# Patient Record
Sex: Female | Born: 1950 | Race: White | Hispanic: No | Marital: Married | Smoking: Never smoker
Health system: Southern US, Community
[De-identification: ages and names within clinical notes are randomized; demographics above are authoritative.]

## PROBLEM LIST (undated history)

## (undated) DIAGNOSIS — E039 Hypothyroidism, unspecified: Secondary | ICD-10-CM

## (undated) DIAGNOSIS — M1990 Unspecified osteoarthritis, unspecified site: Secondary | ICD-10-CM

## (undated) DIAGNOSIS — K219 Gastro-esophageal reflux disease without esophagitis: Secondary | ICD-10-CM

## (undated) DIAGNOSIS — M8580 Other specified disorders of bone density and structure, unspecified site: Secondary | ICD-10-CM

## (undated) DIAGNOSIS — C189 Malignant neoplasm of colon, unspecified: Secondary | ICD-10-CM

---

## 1988-12-29 HISTORY — PX: BREAST CYST ASPIRATION: SHX578

## 2004-11-22 ENCOUNTER — Ambulatory Visit: Payer: Self-pay

## 2005-12-15 ENCOUNTER — Ambulatory Visit: Payer: Self-pay

## 2006-02-03 ENCOUNTER — Ambulatory Visit: Payer: Self-pay | Admitting: General Practice

## 2006-03-12 ENCOUNTER — Ambulatory Visit: Payer: Self-pay | Admitting: Unknown Physician Specialty

## 2006-03-18 ENCOUNTER — Ambulatory Visit: Payer: Self-pay | Admitting: Unknown Physician Specialty

## 2006-03-20 ENCOUNTER — Other Ambulatory Visit: Payer: Self-pay

## 2006-03-27 ENCOUNTER — Inpatient Hospital Stay: Payer: Self-pay | Admitting: Surgery

## 2006-12-16 ENCOUNTER — Ambulatory Visit: Payer: Self-pay | Admitting: Internal Medicine

## 2007-03-10 ENCOUNTER — Ambulatory Visit: Payer: Self-pay | Admitting: Unknown Physician Specialty

## 2007-11-15 ENCOUNTER — Ambulatory Visit: Payer: Self-pay | Admitting: Internal Medicine

## 2007-12-20 ENCOUNTER — Ambulatory Visit: Payer: Self-pay | Admitting: Internal Medicine

## 2008-09-29 ENCOUNTER — Ambulatory Visit: Payer: Self-pay | Admitting: Physician Assistant

## 2008-11-17 ENCOUNTER — Ambulatory Visit: Payer: Self-pay | Admitting: Internal Medicine

## 2008-12-29 DIAGNOSIS — C189 Malignant neoplasm of colon, unspecified: Secondary | ICD-10-CM

## 2008-12-29 HISTORY — DX: Malignant neoplasm of colon, unspecified: C18.9

## 2009-04-23 ENCOUNTER — Ambulatory Visit: Payer: Self-pay | Admitting: Unknown Physician Specialty

## 2009-11-21 ENCOUNTER — Ambulatory Visit: Payer: Self-pay | Admitting: Internal Medicine

## 2010-12-19 ENCOUNTER — Ambulatory Visit: Payer: Self-pay | Admitting: Internal Medicine

## 2012-01-13 ENCOUNTER — Ambulatory Visit: Payer: Self-pay | Admitting: Internal Medicine

## 2012-06-25 ENCOUNTER — Ambulatory Visit: Payer: Self-pay | Admitting: Otolaryngology

## 2012-07-21 ENCOUNTER — Ambulatory Visit: Payer: Self-pay | Admitting: Unknown Physician Specialty

## 2012-07-23 LAB — PATHOLOGY REPORT

## 2012-11-01 ENCOUNTER — Ambulatory Visit: Payer: Self-pay | Admitting: Otolaryngology

## 2012-11-03 LAB — PATHOLOGY REPORT

## 2013-01-11 ENCOUNTER — Ambulatory Visit: Payer: Self-pay | Admitting: Internal Medicine

## 2014-01-12 ENCOUNTER — Ambulatory Visit: Payer: Self-pay | Admitting: Internal Medicine

## 2014-04-17 ENCOUNTER — Ambulatory Visit: Payer: Self-pay | Admitting: Internal Medicine

## 2014-06-08 DIAGNOSIS — E785 Hyperlipidemia, unspecified: Secondary | ICD-10-CM | POA: Insufficient documentation

## 2014-06-08 DIAGNOSIS — I10 Essential (primary) hypertension: Secondary | ICD-10-CM | POA: Insufficient documentation

## 2014-06-08 DIAGNOSIS — M810 Age-related osteoporosis without current pathological fracture: Secondary | ICD-10-CM | POA: Insufficient documentation

## 2014-06-08 DIAGNOSIS — J45909 Unspecified asthma, uncomplicated: Secondary | ICD-10-CM | POA: Insufficient documentation

## 2014-06-08 DIAGNOSIS — C189 Malignant neoplasm of colon, unspecified: Secondary | ICD-10-CM | POA: Insufficient documentation

## 2014-06-08 DIAGNOSIS — E039 Hypothyroidism, unspecified: Secondary | ICD-10-CM | POA: Insufficient documentation

## 2014-06-20 DIAGNOSIS — M5137 Other intervertebral disc degeneration, lumbosacral region: Secondary | ICD-10-CM | POA: Insufficient documentation

## 2014-08-28 ENCOUNTER — Ambulatory Visit: Payer: Self-pay | Admitting: Internal Medicine

## 2014-09-29 DIAGNOSIS — M5416 Radiculopathy, lumbar region: Secondary | ICD-10-CM | POA: Insufficient documentation

## 2014-11-10 DIAGNOSIS — Z85828 Personal history of other malignant neoplasm of skin: Secondary | ICD-10-CM | POA: Insufficient documentation

## 2015-01-15 ENCOUNTER — Ambulatory Visit: Payer: Self-pay | Admitting: Internal Medicine

## 2015-04-17 NOTE — Op Note (Signed)
PATIENT NAME:  Alexa Perry, Alexa Perry MR#:  [id number] DATE OF BIRTH:  July 30, 1951  DATE OF PROCEDURE:  11/01/2012  PREOPERATIVE DIAGNOSES:  1. Right thyroid nodule.  2. Dysphagia.   POSTOPERATIVE DIAGNOSES:  1. Right thyroid nodule.  2. Dysphagia.  PROCEDURE PERFORMED: Minimally invasive right hemithyroidectomy with laryngeal nerve monitoring.   SURGEON: Jerene Bears, MD   ASSISTANT:  Genene Churn, MD  ANESTHESIA: General endotracheal anesthesia.   ESTIMATED BLOOD LOSS: Less than 10 mL.   IV FLUIDS: Please see anesthesia record.   COMPLICATIONS: None.   DRAINS/STENT PLACEMENTS: Surgicel.   SPECIMENS: Right hemithyroid marked with a stitch in the superior aspect.   INDICATIONS FOR PROCEDURE: The patient is a 64 year old female with a history of right-sided thyroid nodule with persistent globus and dysphagia.   OPERATIVE FINDINGS: Right-sided recurrent laryngeal nerve was identified and preserved. A 1.5 cm nodule was adjacent to the insertion of the recurrent laryngeal nerve. The nodule was soft, and the nerve was protected. The right inferior and superior parathyroid glands were identified and preserved.   DESCRIPTION OF PROCEDURE: After the patient was identified in holding, the benefits and risks of the procedure were discussed and consent was reviewed. The patient was taken to the Operating Room and placed in the supine position. General endotracheal anesthesia was induced with laryngeal nerve monitoring. Care was taken to ensure proper placement of the electrodes on the patient's vocal fold. At this time the patient was prepped and draped in a sterile fashion, and injection of 5 mL of 0.25% Sensorcaine with 1:200,000 epinephrine was made. The previously marked skin crease was used for incision with a 15 blade scalpel, and dissection was carried through subcutaneous tissues with Bovie electrocautery. The platysma was divided. The median raphe of the strap muscles was  encountered. This was divided vertically from the sternal notch to the thyroid notch. The sternothyroid and sternohyoid muscles were separated from the anterior border of the thyroid gland on the patient's right side. This demonstrated some mild thickening of the thyroid capsule. There was a median thyroid vein which was ligated with the Harmonic scalpel. A cleft between the trachea and the right superior thyroid lobe was entered with blunt techniques, and the right superior thyroid vessels were isolated and pedicled; and a curved hemostat was attached to the superior pole, and the superior pole was ligated using a Harmonic scalpel. At this time after the superior vessels were ligated, more blunt dissection was made inferiorly. This demonstrated a large nodule  just overlying the recurrent laryngeal nerve. The nerve was identified in the tracheoesophageal groove. The  inferior and superior parathyroid glands were identified and protected. The nerve was traced inferiorly and superiorly until its insertion into the patient's larynx, and carefully the right  hemithyroid was dissected away from the trachea and the larynx. Care was taken to avoid injury to nerve. The nerve had robust stimulation throughout the duration of the case as well as at the conclusion of the case. At this time, the remaining attachments of the right hemithyroid were separated from the trachea. The isthmus was divided slightly left to midline with the Harmonic scalpel, and the specimen was marked with a stitch in the superior pole on the right side and was passed off the table for permanent pathological evaluation. At this time, the patient's wound was copiously irrigated with sterile saline. Meticulous hemostasis was achieved. Surgicel was placed along the thyroid bed. Strap muscles were reapproximated with figure-of-eight Vicryl stitch of 4-0, and then the  subcutaneous tissues were reapproximated with three interrupted 4-0 Vicryls. The skin was  closed with Dermabond skin adhesive and topped with a Steri-Strip. Care of the patient at this time was transferred to anesthesia.  ____________________________ Jerene Bears, MD ccv:cbb D: 11/01/2012 09:13:04 ET T: 11/01/2012 10:28:47 ET JOB#: [id number]  cc: Jerene Bears, MD, <Dictator> Jerene Bears MD ELECTRONICALLY SIGNED 11/01/2012 12:02

## 2015-12-26 ENCOUNTER — Other Ambulatory Visit: Payer: Self-pay | Admitting: Internal Medicine

## 2015-12-26 DIAGNOSIS — Z1231 Encounter for screening mammogram for malignant neoplasm of breast: Secondary | ICD-10-CM

## 2016-01-17 ENCOUNTER — Ambulatory Visit
Admission: RE | Admit: 2016-01-17 | Discharge: 2016-01-17 | Disposition: A | Discharge status: Home / Self Care | Payer: BC Managed Care – PPO | Source: Ambulatory Visit | Attending: Internal Medicine | Admitting: Internal Medicine

## 2016-01-17 DIAGNOSIS — Z1231 Encounter for screening mammogram for malignant neoplasm of breast: Secondary | ICD-10-CM

## 2016-01-17 HISTORY — DX: Malignant neoplasm of colon, unspecified: C18.9

## 2016-12-08 ENCOUNTER — Other Ambulatory Visit: Payer: Self-pay | Admitting: Internal Medicine

## 2016-12-08 DIAGNOSIS — Z1231 Encounter for screening mammogram for malignant neoplasm of breast: Secondary | ICD-10-CM

## 2017-01-28 ENCOUNTER — Ambulatory Visit
Admission: RE | Admit: 2017-01-28 | Discharge: 2017-01-28 | Disposition: A | Discharge status: Home / Self Care | Payer: BC Managed Care – PPO | Source: Ambulatory Visit | Attending: Internal Medicine | Admitting: Internal Medicine

## 2017-01-28 DIAGNOSIS — Z1231 Encounter for screening mammogram for malignant neoplasm of breast: Secondary | ICD-10-CM

## 2017-10-22 ENCOUNTER — Other Ambulatory Visit: Payer: Self-pay | Admitting: Internal Medicine

## 2017-10-22 DIAGNOSIS — Z1231 Encounter for screening mammogram for malignant neoplasm of breast: Secondary | ICD-10-CM

## 2018-02-01 ENCOUNTER — Ambulatory Visit
Admission: RE | Admit: 2018-02-01 | Discharge: 2018-02-01 | Disposition: A | Discharge status: Home / Self Care | Payer: BC Managed Care – PPO | Source: Ambulatory Visit | Attending: Internal Medicine | Admitting: Internal Medicine

## 2018-02-01 DIAGNOSIS — Z1231 Encounter for screening mammogram for malignant neoplasm of breast: Secondary | ICD-10-CM

## 2018-06-01 ENCOUNTER — Ambulatory Visit
Admission: RE | Admit: 2018-06-01 | Discharge: 2018-06-01 | Disposition: A | Discharge status: Home / Self Care | Payer: Medicare Other | Source: Ambulatory Visit | Attending: Otolaryngology | Admitting: Otolaryngology

## 2018-06-01 ENCOUNTER — Other Ambulatory Visit: Payer: Self-pay | Admitting: Otolaryngology

## 2018-06-01 DIAGNOSIS — R05 Cough: Secondary | ICD-10-CM | POA: Insufficient documentation

## 2018-06-01 DIAGNOSIS — R059 Cough, unspecified: Secondary | ICD-10-CM

## 2018-06-01 DIAGNOSIS — R918 Other nonspecific abnormal finding of lung field: Secondary | ICD-10-CM | POA: Diagnosis not present

## 2018-11-24 ENCOUNTER — Other Ambulatory Visit: Payer: Self-pay | Admitting: Internal Medicine

## 2018-11-24 DIAGNOSIS — Z1231 Encounter for screening mammogram for malignant neoplasm of breast: Secondary | ICD-10-CM

## 2019-02-02 ENCOUNTER — Ambulatory Visit
Admission: RE | Admit: 2019-02-02 | Discharge: 2019-02-02 | Disposition: A | Discharge status: Home / Self Care | Payer: Medicare Other | Source: Ambulatory Visit | Attending: Internal Medicine | Admitting: Internal Medicine

## 2019-02-02 DIAGNOSIS — Z1231 Encounter for screening mammogram for malignant neoplasm of breast: Secondary | ICD-10-CM | POA: Diagnosis present

## 2019-12-26 ENCOUNTER — Other Ambulatory Visit: Payer: Self-pay | Admitting: Internal Medicine

## 2019-12-26 DIAGNOSIS — Z1231 Encounter for screening mammogram for malignant neoplasm of breast: Secondary | ICD-10-CM

## 2020-02-06 ENCOUNTER — Ambulatory Visit
Admission: RE | Admit: 2020-02-06 | Discharge: 2020-02-06 | Disposition: A | Discharge status: Home / Self Care | Payer: Medicare Other | Source: Ambulatory Visit | Attending: Internal Medicine | Admitting: Internal Medicine

## 2020-02-06 DIAGNOSIS — Z1231 Encounter for screening mammogram for malignant neoplasm of breast: Secondary | ICD-10-CM | POA: Insufficient documentation

## 2020-05-07 ENCOUNTER — Encounter: Payer: Self-pay | Admitting: Gastroenterology

## 2020-05-07 ENCOUNTER — Other Ambulatory Visit: Payer: Self-pay

## 2020-05-07 ENCOUNTER — Ambulatory Visit (INDEPENDENT_AMBULATORY_CARE_PROVIDER_SITE_OTHER): Payer: Medicare Other | Admitting: Gastroenterology

## 2020-05-07 VITALS — BP 101/71 | HR 85 | Temp 98.3°F | Ht 64.0 in | Wt 168.0 lb

## 2020-05-07 DIAGNOSIS — G8929 Other chronic pain: Secondary | ICD-10-CM

## 2020-05-07 DIAGNOSIS — R140 Abdominal distension (gaseous): Secondary | ICD-10-CM

## 2020-05-07 DIAGNOSIS — K219 Gastro-esophageal reflux disease without esophagitis: Secondary | ICD-10-CM | POA: Diagnosis not present

## 2020-05-07 DIAGNOSIS — R1013 Epigastric pain: Secondary | ICD-10-CM | POA: Diagnosis not present

## 2020-05-07 NOTE — Progress Notes (Signed)
Gastroenterology Consultation  Referring Provider:     Idelle Perry, Alexa Perry:  Alexa Perry, Alexa Perry:  Alexa Perry     Reason for Consultation:     GERD and abdominal pain        HPI:   Alexa Perry is a 68 y.o. y/o female referred for consultation & management of GERD and abdominal pain by Alexa Perry, Alexa Perry, Alexa Perry.  This patient comes in today with a history of having a colon resection by Alexa Perry for colon cancer.  The patient has been getting follow-up colonoscopies in the past by Alexa Perry but has not had a repeat colonoscopy in a number of years.  The patient states that she is concerned about that.  She is on omeprazole once a day and she takes it in the morning and was given a H2 blocker at night but states that she continues to have nocturnal acid breakthrough on a regular basis.  She stopped taking the H2 blocker at night and has not noticed much difference.  The patient denies any unexplained weight loss.  She also denies any black stools or bloody stools. Patient also reports that approximately 3 times a year she gets severe abdominal pain that she states makes her abdomen feel hard and bloated with distention and it last for up to 48 hours.  There is no report of any worsening of her symptoms or any foods that cause her to have this.  Past Medical History:  Diagnosis Date  . Colon cancer ([place]) 2010    Past Surgical History:  Procedure Laterality Date  . BREAST CYST ASPIRATION Left 1990    Prior to Admission medications   Medication Sig Start Date End Date Taking? Authorizing Provider  Adapalene-Benzoyl Peroxide 0.1-2.5 % gel as needed Apply thinly to face at night 03/30/15  Yes Provider, Historical, Alexa Perry  cetirizine (ZYRTEC) 10 MG tablet Take by mouth.   Yes Provider, Historical, Alexa Perry  Cholecalciferol 50 MCG (2000 UT) CAPS Take by mouth.   Yes Provider, Historical, Alexa Perry  Docusate Sodium (DSS) 100 MG CAPS Take by mouth.   Yes  Provider, Historical, Alexa Perry  fluticasone (FLONASE) 50 MCG/ACT nasal spray SMARTSIG:2 Spray(s) Both Nares Every Night 03/28/20  Yes Provider, Historical, Alexa Perry  hydrochlorothiazide (HYDRODIURIL) 25 MG tablet Take 25 mg by mouth daily. 01/09/20  Yes Provider, Historical, Alexa Perry  levothyroxine (SYNTHROID) 125 MCG tablet PLEASE SEE ATTACHED FOR DETAILED DIRECTIONS 04/13/20  Yes Provider, Historical, Alexa Perry  naproxen sodium (ALEVE) 220 MG tablet Take by mouth.   Yes Provider, Historical, Alexa Perry  omeprazole (PRILOSEC) 40 MG capsule Take 40 mg by mouth daily. 04/15/20  Yes Provider, Historical, Alexa Perry    Family History  Problem Relation Age of Onset  . Breast cancer Mother 38     Social History   Tobacco Use  . Smoking status: Never Smoker  . Smokeless tobacco: Never Used  Substance Use Topics  . Alcohol use: Yes  . Drug use: Never    Allergies as of 05/07/2020  . (No Known Allergies)    Review of Systems:    All systems reviewed and negative except where noted in HPI.   Physical Exam:  BP 101/71   Pulse 85   Temp 98.3 F (36.8 C) (Temporal)   Ht 5\' 4"  (1.626 m)   Wt 168 lb (76.2 kg)   BMI 28.84 kg/m  No LMP recorded. Patient is postmenopausal. General:   Alert,  Well-developed, well-nourished, pleasant  and cooperative in NAD Head:  Normocephalic and atraumatic. Eyes:  Sclera clear, no icterus.   Conjunctiva pink. Ears:  Normal auditory acuity. Neck:  Supple; no masses or thyromegaly. Lungs:  Respirations even and unlabored.  Clear throughout to auscultation.   No wheezes, crackles, or rhonchi. No acute distress. Heart:  Regular rate and rhythm; no murmurs, clicks, rubs, or gallops. Abdomen:  Normal bowel sounds.  No bruits.  Soft, non-tender and non-distended without masses, hepatosplenomegaly or hernias noted.  No guarding or rebound tenderness.  Negative Carnett sign.   Rectal:  Deferred.  Pulses:  Normal pulses noted. Extremities:  No clubbing or edema.  No cyanosis. Neurologic:  Alert and  oriented x3;  grossly normal neurologically. Skin:  Intact without significant lesions or rashes.  No jaundice. Lymph Nodes:  No significant cervical adenopathy. Psych:  Alert and cooperative. Normal mood and affect.  Imaging Studies: No results found.  Assessment and Plan:   Alexa Perry is a 69 y.o. y/o female who comes in today with a history of colon cancer with a resection.  The patient now reports that she is due for a colonoscopy and she will also have an upper endoscopy at that time due to her concerns because of chronic reflux symptoms.  The patient has been given samples of Dexilant which she will take in the evening since most of her symptoms occur at 2:00 am- 3:00am.  She has also been told that the [place] does not need to be adjusted with food like omeprazole.  The patient has also been told to avoid chewing gum, drinking carbonated drinks, eating fast or using straws to decrease the amount of air she is swallowing to try and alleviate some of the bloating she is having.  The patient has been explained the plan agrees with it.  She will follow up at the time of the procedures.    Alexa Perry, Alexa Perry. Marval Regal    Note: This dictation was prepared with Dragon dictation along with smaller phrase technology. Any transcriptional errors that result from this process are unintentional.

## 2020-05-07 NOTE — H&P (View-Only) (Signed)
Gastroenterology Consultation  Referring Provider:     Idelle Crouch, MD Primary Care Physician:  Idelle Crouch, MD Primary Gastroenterologist:  Dr. Allen Norris     Reason for Consultation:     GERD and abdominal pain        HPI:   Alexa Perry is a 69 y.o. y/o female referred for consultation & management of GERD and abdominal pain by Dr. Doy Hutching, Leonie Douglas, MD.  This patient comes in today with a history of having a colon resection by Dr. Pat Patrick for colon cancer.  The patient has been getting follow-up colonoscopies in the past by Dr. Vira Agar but has not had a repeat colonoscopy in a number of years.  The patient states that she is concerned about that.  She is on omeprazole once a day and she takes it in the morning and was given a H2 blocker at night but states that she continues to have nocturnal acid breakthrough on a regular basis.  She stopped taking the H2 blocker at night and has not noticed much difference.  The patient denies any unexplained weight loss.  She also denies any black stools or bloody stools. Patient also reports that approximately 3 times a year she gets severe abdominal pain that she states makes her abdomen feel hard and bloated with distention and it last for up to 48 hours.  There is no report of any worsening of her symptoms or any foods that cause her to have this.  Past Medical History:  Diagnosis Date  . Colon cancer ([place]) 2010    Past Surgical History:  Procedure Laterality Date  . BREAST CYST ASPIRATION Left 1990    Prior to Admission medications   Medication Sig Start Date End Date Taking? Authorizing Provider  Adapalene-Benzoyl Peroxide 0.1-2.5 % gel as needed Apply thinly to face at night 03/30/15  Yes [provider]  cetirizine (ZYRTEC) 10 MG tablet Take by mouth.   Yes [provider]  Cholecalciferol 50 MCG (2000 UT) CAPS Take by mouth.   Yes [provider]  Docusate Sodium (DSS) 100 MG CAPS Take by mouth.   Yes  [provider]  fluticasone (FLONASE) 50 MCG/ACT nasal spray SMARTSIG:2 Spray(s) Both Nares Every Night 03/28/20  Yes [provider]  hydrochlorothiazide (HYDRODIURIL) 25 MG tablet Take 25 mg by mouth daily. 01/09/20  Yes [provider]  levothyroxine (SYNTHROID) 125 MCG tablet PLEASE SEE ATTACHED FOR DETAILED DIRECTIONS 04/13/20  Yes [provider]  naproxen sodium (ALEVE) 220 MG tablet Take by mouth.   Yes [provider]  omeprazole (PRILOSEC) 40 MG capsule Take 40 mg by mouth daily. 04/15/20  Yes [provider]    Family History  Problem Relation Age of Onset  . Breast cancer Mother 8     Social History   Tobacco Use  . Smoking status: Never Smoker  . Smokeless tobacco: Never Used  Substance Use Topics  . Alcohol use: Yes  . Drug use: Never    Allergies as of 05/07/2020  . (No Known Allergies)    Review of Systems:    All systems reviewed and negative except where noted in HPI.   Physical Exam:  BP 101/71   Pulse 85   Temp 98.3 F (36.8 C) (Temporal)   Ht 5\' 4"  (1.626 m)   Wt 168 lb (76.2 kg)   BMI 28.84 kg/m  No LMP recorded. Patient is postmenopausal. General:   Alert,  Well-developed, well-nourished, pleasant  and cooperative in NAD Head:  Normocephalic and atraumatic. Eyes:  Sclera clear, no icterus.   Conjunctiva pink. Ears:  Normal auditory acuity. Neck:  Supple; no masses or thyromegaly. Lungs:  Respirations even and unlabored.  Clear throughout to auscultation.   No wheezes, crackles, or rhonchi. No acute distress. Heart:  Regular rate and rhythm; no murmurs, clicks, rubs, or gallops. Abdomen:  Normal bowel sounds.  No bruits.  Soft, non-tender and non-distended without masses, hepatosplenomegaly or hernias noted.  No guarding or rebound tenderness.  Negative Carnett sign.   Rectal:  Deferred.  Pulses:  Normal pulses noted. Extremities:  No clubbing or edema.  No cyanosis. Neurologic:  Alert and  oriented x3;  grossly normal neurologically. Skin:  Intact without significant lesions or rashes.  No jaundice. Lymph Nodes:  No significant cervical adenopathy. Psych:  Alert and cooperative. Normal mood and affect.  Imaging Studies: No results found.  Assessment and Plan:   Alexa Perry is a 69 y.o. y/o female who comes in today with a history of colon cancer with a resection.  The patient now reports that she is due for a colonoscopy and she will also have an upper endoscopy at that time due to her concerns because of chronic reflux symptoms.  The patient has been given samples of Dexilant which she will take in the evening since most of her symptoms occur at 2:00 am- 3:00am.  She has also been told that the [place] does not need to be adjusted with food like omeprazole.  The patient has also been told to avoid chewing gum, drinking carbonated drinks, eating fast or using straws to decrease the amount of air she is swallowing to try and alleviate some of the bloating she is having.  The patient has been explained the plan agrees with it.  She will follow up at the time of the procedures.    Lucilla Lame, MD. Marval Regal    Note: This dictation was prepared with Dragon dictation along with smaller phrase technology. Any transcriptional errors that result from this process are unintentional.

## 2020-05-08 ENCOUNTER — Encounter: Payer: Self-pay | Admitting: Gastroenterology

## 2020-05-08 ENCOUNTER — Other Ambulatory Visit: Payer: Self-pay

## 2020-05-08 ENCOUNTER — Other Ambulatory Visit
Admission: RE | Admit: 2020-05-08 | Discharge: 2020-05-08 | Disposition: A | Discharge status: Home / Self Care | Payer: Medicare Other | Source: Ambulatory Visit | Attending: Gastroenterology | Admitting: Gastroenterology

## 2020-05-08 DIAGNOSIS — Z20822 Contact with and (suspected) exposure to covid-19: Secondary | ICD-10-CM | POA: Insufficient documentation

## 2020-05-08 DIAGNOSIS — Z01812 Encounter for preprocedural laboratory examination: Secondary | ICD-10-CM | POA: Insufficient documentation

## 2020-05-08 LAB — SARS CORONAVIRUS 2 (TAT 6-24 HRS): SARS Coronavirus 2: NEGATIVE

## 2020-05-09 NOTE — Discharge Instructions (Signed)
General Anesthesia, Adult, Care After This sheet gives you information about how to care for yourself after your procedure. Your health care provider may also give you more specific instructions. If you have problems or questions, contact your health care provider. What can I expect after the procedure? After the procedure, the following side effects are common:  Pain or discomfort at the IV site.  Nausea.  Vomiting.  Sore throat.  Trouble concentrating.  Feeling cold or chills.  Weak or tired.  Sleepiness and fatigue.  Soreness and body aches. These side effects can affect parts of the body that were not involved in surgery. Follow these instructions at home:  For at least 24 hours after the procedure:  Have a responsible adult stay with you. It is important to have someone help care for you until you are awake and alert.  Rest as needed.  Do not: ? Participate in activities in which you could fall or become injured. ? Drive. ? Use heavy machinery. ? Drink alcohol. ? Take sleeping pills or medicines that cause drowsiness. ? Make important decisions or sign legal documents. ? Take care of children on your own. Eating and drinking  Follow any instructions from your health care provider about eating or drinking restrictions.  When you feel hungry, start by eating small amounts of foods that are soft and easy to digest (bland), such as toast. Gradually return to your regular diet.  Drink enough fluid to keep your urine pale yellow.  If you vomit, rehydrate by drinking water, juice, or clear broth. General instructions  If you have sleep apnea, surgery and certain medicines can increase your risk for breathing problems. Follow instructions from your health care provider about wearing your sleep device: ? Anytime you are sleeping, including during daytime naps. ? While taking prescription pain medicines, sleeping medicines, or medicines that make you drowsy.  Return to  your normal activities as told by your health care provider. Ask your health care provider what activities are safe for you.  Take over-the-counter and prescription medicines only as told by your health care provider.  If you smoke, do not smoke without supervision.  Keep all follow-up visits as told by your health care provider. This is important. Contact a health care provider if:  You have nausea or vomiting that does not get better with medicine.  You cannot eat or drink without vomiting.  You have pain that does not get better with medicine.  You are unable to pass urine.  You develop a skin rash.  You have a fever.  You have redness around your IV site that gets worse. Get help right away if:  You have difficulty breathing.  You have chest pain.  You have blood in your urine or stool, or you vomit blood. Summary  After the procedure, it is common to have a sore throat or nausea. It is also common to feel tired.  Have a responsible adult stay with you for the first 24 hours after general anesthesia. It is important to have someone help care for you until you are awake and alert.  When you feel hungry, start by eating small amounts of foods that are soft and easy to digest (bland), such as toast. Gradually return to your regular diet.  Drink enough fluid to keep your urine pale yellow.  Return to your normal activities as told by your health care provider. Ask your health care provider what activities are safe for you. This information is not   intended to replace advice given to you by your health care provider. Make sure you discuss any questions you have with your health care provider. Document Revised: 12/18/2017 Document Reviewed: 07/31/2017 Elsevier Patient Education  2020 [employer]vier Inc.  

## 2020-05-10 ENCOUNTER — Encounter
Admission: RE | Disposition: A | Discharge status: Home / Self Care | Payer: Self-pay | Source: Home / Self Care | Attending: Gastroenterology

## 2020-05-10 ENCOUNTER — Other Ambulatory Visit: Payer: Self-pay

## 2020-05-10 ENCOUNTER — Ambulatory Visit: Payer: Medicare Other | Admitting: Anesthesiology

## 2020-05-10 ENCOUNTER — Ambulatory Visit
Admission: RE | Admit: 2020-05-10 | Discharge: 2020-05-10 | Disposition: A | Discharge status: Home / Self Care | Payer: Medicare Other | Attending: Gastroenterology | Admitting: Gastroenterology

## 2020-05-10 ENCOUNTER — Encounter: Payer: Self-pay | Admitting: Gastroenterology

## 2020-05-10 DIAGNOSIS — K219 Gastro-esophageal reflux disease without esophagitis: Secondary | ICD-10-CM | POA: Diagnosis not present

## 2020-05-10 DIAGNOSIS — K2970 Gastritis, unspecified, without bleeding: Secondary | ICD-10-CM | POA: Diagnosis not present

## 2020-05-10 DIAGNOSIS — I10 Essential (primary) hypertension: Secondary | ICD-10-CM | POA: Diagnosis not present

## 2020-05-10 DIAGNOSIS — R12 Heartburn: Secondary | ICD-10-CM | POA: Insufficient documentation

## 2020-05-10 DIAGNOSIS — J45909 Unspecified asthma, uncomplicated: Secondary | ICD-10-CM | POA: Diagnosis not present

## 2020-05-10 DIAGNOSIS — Z1211 Encounter for screening for malignant neoplasm of colon: Secondary | ICD-10-CM | POA: Insufficient documentation

## 2020-05-10 DIAGNOSIS — Z79899 Other long term (current) drug therapy: Secondary | ICD-10-CM | POA: Diagnosis not present

## 2020-05-10 DIAGNOSIS — K641 Second degree hemorrhoids: Secondary | ICD-10-CM | POA: Diagnosis not present

## 2020-05-10 DIAGNOSIS — Z803 Family history of malignant neoplasm of breast: Secondary | ICD-10-CM | POA: Insufficient documentation

## 2020-05-10 DIAGNOSIS — Z85038 Personal history of other malignant neoplasm of large intestine: Secondary | ICD-10-CM | POA: Diagnosis not present

## 2020-05-10 DIAGNOSIS — K635 Polyp of colon: Secondary | ICD-10-CM

## 2020-05-10 DIAGNOSIS — D124 Benign neoplasm of descending colon: Secondary | ICD-10-CM | POA: Insufficient documentation

## 2020-05-10 DIAGNOSIS — K2900 Acute gastritis without bleeding: Secondary | ICD-10-CM

## 2020-05-10 DIAGNOSIS — D125 Benign neoplasm of sigmoid colon: Secondary | ICD-10-CM | POA: Diagnosis not present

## 2020-05-10 DIAGNOSIS — E039 Hypothyroidism, unspecified: Secondary | ICD-10-CM | POA: Insufficient documentation

## 2020-05-10 DIAGNOSIS — K319 Disease of stomach and duodenum, unspecified: Secondary | ICD-10-CM | POA: Insufficient documentation

## 2020-05-10 DIAGNOSIS — Z980 Intestinal bypass and anastomosis status: Secondary | ICD-10-CM | POA: Diagnosis not present

## 2020-05-10 HISTORY — DX: Unspecified osteoarthritis, unspecified site: M19.90

## 2020-05-10 HISTORY — DX: Hypothyroidism, unspecified: E03.9

## 2020-05-10 HISTORY — PX: POLYPECTOMY: SHX5525

## 2020-05-10 HISTORY — DX: Gastro-esophageal reflux disease without esophagitis: K21.9

## 2020-05-10 HISTORY — PX: ESOPHAGOGASTRODUODENOSCOPY (EGD) WITH PROPOFOL: SHX5813

## 2020-05-10 HISTORY — DX: Other specified disorders of bone density and structure, unspecified site: M85.80

## 2020-05-10 HISTORY — PX: COLONOSCOPY WITH PROPOFOL: SHX5780

## 2020-05-10 SURGERY — COLONOSCOPY WITH PROPOFOL
Anesthesia: General | Site: Rectum

## 2020-05-10 MED ORDER — PROPOFOL 10 MG/ML IV BOLUS
INTRAVENOUS | Status: DC | PRN
  Administered 2020-05-10 (×2): 20 mg via INTRAVENOUS
  Administered 2020-05-10: 80 mg via INTRAVENOUS
  Administered 2020-05-10 (×6): 20 mg via INTRAVENOUS

## 2020-05-10 MED ORDER — OXYCODONE HCL 5 MG PO TABS: 5.0000 mg | ORAL_TABLET | Freq: Once | ORAL | Status: DC | PRN

## 2020-05-10 MED ORDER — OXYCODONE HCL 5 MG/5ML PO SOLN: 5.0000 mg | Freq: Once | ORAL | Status: DC | PRN

## 2020-05-10 MED ORDER — LIDOCAINE HCL (CARDIAC) PF 100 MG/5ML IV SOSY
PREFILLED_SYRINGE | INTRAVENOUS | Status: DC | PRN
  Administered 2020-05-10: 40 mg via INTRAVENOUS

## 2020-05-10 MED ORDER — STERILE WATER FOR IRRIGATION IR SOLN
Status: DC | PRN
  Administered 2020-05-10: 50 mL

## 2020-05-10 MED ORDER — SODIUM CHLORIDE 0.9 % IV SOLN: INTRAVENOUS | Status: DC

## 2020-05-10 MED ORDER — GLYCOPYRROLATE 0.2 MG/ML IJ SOLN
INTRAMUSCULAR | Status: DC | PRN
  Administered 2020-05-10: .1 mg via INTRAVENOUS

## 2020-05-10 MED ORDER — LACTATED RINGERS IV SOLN
INTRAVENOUS | Status: DC | PRN
Start: 2020-05-10 — End: 2020-05-10

## 2020-05-10 SURGICAL SUPPLY — 9 items
BLOCK BITE 60FR ADLT L/F GRN (MISCELLANEOUS) ×4 IMPLANT
FORCEPS BIOP RAD 4 LRG CAP 4 (CUTTING FORCEPS) ×2 IMPLANT
GOWN CVR UNV OPN BCK APRN NK (MISCELLANEOUS) ×4 IMPLANT
GOWN ISOL THUMB LOOP REG UNIV (MISCELLANEOUS) ×4
KIT ENDO PROCEDURE OLY (KITS) ×4 IMPLANT
MANIFOLD NEPTUNE II (INSTRUMENTS) ×2 IMPLANT
SNARE SHORT THROW 13M SML OVAL (MISCELLANEOUS) ×2 IMPLANT
TRAP ETRAP POLY (MISCELLANEOUS) ×2 IMPLANT
WATER STERILE IRR 250ML POUR (IV SOLUTION) ×4 IMPLANT

## 2020-05-10 NOTE — Op Note (Addendum)
[facility] Gastroenterology Patient Name: Alexa Perry Procedure Date: 05/10/2020 10:10 AM MRN: [id number] Account #: [id number] Date of Birth: September 12, 1951 Admit Type: Outpatient Age: 69 Room: [facility] OR ROOM 01 Gender: Female Note Status: Finalized Procedure:             Colonoscopy Indications:           High risk colon cancer surveillance: Personal history                         of colon cancer Providers:             Lucilla Lame MD, MD Medicines:             Propofol per Anesthesia Complications:         No immediate complications. Procedure:             Pre-Anesthesia Assessment:                        - Prior to the procedure, a History and Physical was                         performed, and patient medications and allergies were                         reviewed. The patient's tolerance of previous                         anesthesia was also reviewed. The risks and benefits                         of the procedure and the sedation options and risks                         were discussed with the patient. All questions were                         answered, and informed consent was obtained. Prior                         Anticoagulants: The patient has taken no previous                         anticoagulant or antiplatelet agents. ASA Grade                         Assessment: II - A patient with mild systemic disease.                         After reviewing the risks and benefits, the patient                         was deemed in satisfactory condition to undergo the                         procedure.                        After obtaining informed consent, the colonoscope was  passed under direct vision. Throughout the procedure,                         the patient's blood pressure, pulse, and oxygen                         saturations were monitored continuously. The                         Colonoscope was introduced through the anus  and                         advanced to the the cecum, identified by appendiceal                         orifice and ileocecal valve. The colonoscopy was                         performed without difficulty. The patient tolerated                         the procedure well. The quality of the bowel                         preparation was excellent. Findings:      The perianal and digital rectal examinations were normal.      There was evidence of a prior end-to-end colo-colonic anastomosis in the       sigmoid colon. This was patent.      A 5 mm polyp was found in the sigmoid colon. The polyp was sessile. The       polyp was removed with a cold snare. Resection and retrieval were       complete.      A 2 mm polyp was found in the descending colon. The polyp was sessile.       The polyp was removed with a cold biopsy forceps. Resection and       retrieval were complete.      Non-bleeding internal hemorrhoids were found during retroflexion. The       hemorrhoids were Grade II (internal hemorrhoids that prolapse but reduce       spontaneously). Impression:            - Patent end-to-end colo-colonic anastomosis.                        - One 5 mm polyp in the sigmoid colon, removed with a                         cold snare. Resected and retrieved.                        - One 2 mm polyp in the descending colon, removed with                         a cold biopsy forceps. Resected and retrieved.                        - Non-bleeding internal hemorrhoids. Recommendation:        - Discharge  patient to home.                        - Resume previous diet.                        - Continue present medications.                        - Await pathology results.                        - Repeat colonoscopy in 5 years for surveillance. Procedure Code(s):     --- Professional ---                        [phone], Colonoscopy, flexible; with removal of                         tumor(s), polyp(s), or other  lesion(s) by snare                         technique                        45380, 93, Colonoscopy, flexible; with biopsy, single                         or multiple Diagnosis Code(s):     --- Professional ---                        [id number], Personal history of other malignant neoplasm                         of large intestine                        K63.5, Polyp of colon CPT copyright 2019 [employer]. All rights reserved. The codes documented in this report are preliminary and upon coder review may  be revised to meet current compliance requirements. Lucilla Lame MD, MD 05/10/2020 10:46:20 AM This report has been signed electronically. Number of Addenda: 0 Note Initiated On: 05/10/2020 10:10 AM Scope Withdrawal Time: 0 hours 6 minutes 0 seconds  Total Procedure Duration: 0 hours 11 minutes 12 seconds  Estimated Blood Loss:  Estimated blood loss: none.      [facility]

## 2020-05-10 NOTE — Anesthesia Preprocedure Evaluation (Addendum)
Anesthesia Evaluation  Patient identified by MRN, date of birth, ID band Patient awake    Reviewed: Allergy & Precautions, H&P , NPO status , Patient's Chart, lab work & pertinent test results  Airway Mallampati: II  TM Distance: >3 FB Neck ROM: full    Dental no notable dental hx.    Pulmonary asthma ,    Pulmonary exam normal        Cardiovascular hypertension, On Medications Normal cardiovascular exam Rhythm:regular Rate:Normal     Neuro/Psych    GI/Hepatic Neg liver ROS, Medicated,  Endo/Other  Hypothyroidism   Renal/GU negative Renal ROS     Musculoskeletal   Abdominal   Peds  Hematology negative hematology ROS (+)   Anesthesia Other Findings   Reproductive/Obstetrics                            Anesthesia Physical Anesthesia Plan  ASA: II  Anesthesia Plan: General   Post-op Pain Management:    Induction:   PONV Risk Score and Plan:   Airway Management Planned:   Additional Equipment:   Intra-op Plan:   Post-operative Plan:   Informed Consent: I have reviewed the patients History and Physical, chart, labs and discussed the procedure including the risks, benefits and alternatives for the proposed anesthesia with the patient or authorized representative who has indicated his/her understanding and acceptance.       Plan Discussed with:   Anesthesia Plan Comments:         Anesthesia Quick Evaluation

## 2020-05-10 NOTE — Anesthesia Procedure Notes (Signed)
Performed by: Shaley Leavens, CRNA Pre-anesthesia Checklist: Patient identified, Emergency Drugs available, Suction available, Timeout performed and Patient being monitored Patient Re-evaluated:Patient Re-evaluated prior to induction Oxygen Delivery Method: Nasal cannula Placement Confirmation: positive ETCO2       

## 2020-05-10 NOTE — Interval H&P Note (Signed)
History and Physical Interval Note:  05/10/2020 9:33 AM  Alexa Perry  has presented today for surgery, with the diagnosis of GERD K21.9 hx of colon cancer Z85.038.  The various methods of treatment have been discussed with the patient and family. After consideration of risks, benefits and other options for treatment, the patient has consented to  Procedure(s) with comments: COLONOSCOPY WITH PROPOFOL (N/A) - priority 3 ESOPHAGOGASTRODUODENOSCOPY (EGD) WITH PROPOFOL (N/A) as a surgical intervention.  The patient's history has been reviewed, patient examined, no change in status, stable for surgery.  I have reviewed the patient's chart and labs.  Questions were answered to the patient's satisfaction.     Ibraham Levi [employer]

## 2020-05-10 NOTE — Transfer of Care (Signed)
Immediate Anesthesia Transfer of Care Note  Patient: Alexa Perry  Procedure(s) Performed: COLONOSCOPY WITH BIOPSY (N/A Rectum) ESOPHAGOGASTRODUODENOSCOPY (EGD) WITH BIOPSY (N/A Mouth) POLYPECTOMY (N/A Rectum)  Patient Location: PACU  Anesthesia Type: General  Level of Consciousness: awake, alert  and patient cooperative  Airway and Oxygen Therapy: Patient Spontanous Breathing and Patient connected to supplemental oxygen  Post-op Assessment: Post-op Vital signs reviewed, Patient's Cardiovascular Status Stable, Respiratory Function Stable, Patent Airway and No signs of Nausea or vomiting  Post-op Vital Signs: Reviewed and stable  Complications: No apparent anesthesia complications

## 2020-05-10 NOTE — Op Note (Addendum)
[facility] Gastroenterology Patient Name: Alexa Perry Procedure Date: 05/10/2020 10:12 AM MRN: [id number] Account #: [id number] Date of Birth: 1951-09-06 Admit Type: Outpatient Age: 69 Room: [facility] OR ROOM 01 Gender: Female Note Status: Finalized Procedure:             Upper GI endoscopy Indications:           Heartburn Providers:             Lucilla Lame MD, MD Referring MD:          Leonie Douglas. Doy Hutching, MD (Referring MD) Medicines:             Propofol per Anesthesia Complications:         No immediate complications. Procedure:             Pre-Anesthesia Assessment:                        - Prior to the procedure, a History and Physical was                         performed, and patient medications and allergies were                         reviewed. The patient's tolerance of previous                         anesthesia was also reviewed. The risks and benefits                         of the procedure and the sedation options and risks                         were discussed with the patient. All questions were                         answered, and informed consent was obtained. Prior                         Anticoagulants: The patient has taken no previous                         anticoagulant or antiplatelet agents. ASA Grade                         Assessment: II - A patient with mild systemic disease.                         After reviewing the risks and benefits, the patient                         was deemed in satisfactory condition to undergo the                         procedure.                        After obtaining informed consent, the endoscope was  passed under direct vision. Throughout the procedure,                         the patient's blood pressure, pulse, and oxygen                         saturations were monitored continuously. The Endoscope                         was introduced through the mouth, and advanced to the                          second part of duodenum. The upper GI endoscopy was                         accomplished without difficulty. The patient tolerated                         the procedure well. Findings:      The examined esophagus was normal.      Localized mild inflammation characterized by erythema was found in the       gastric antrum. Biopsies were taken with a cold forceps for histology.      The examined duodenum was normal. Impression:            - Normal esophagus.                        - Gastritis. Biopsied.                        - Normal examined duodenum. Recommendation:        - Discharge patient to home.                        - Resume previous diet.                        - Continue present medications.                        - Await pathology results.                        - Perform a colonoscopy today. Procedure Code(s):     --- Professional ---                        [id number], Esophagogastroduodenoscopy, flexible,                         transoral; with biopsy, single or multiple Diagnosis Code(s):     --- Professional ---                        R12, Heartburn                        K29.70, Gastritis, unspecified, without bleeding CPT copyright 2019 American Medical Association. All rights reserved. The codes documented in this report are preliminary and upon coder review may  be revised to meet current compliance requirements. Lucilla Lame MD, MD 05/10/2020 10:31:01 AM This  report has been signed electronically. Number of Addenda: 0 Note Initiated On: 05/10/2020 10:12 AM Total Procedure Duration: 0 hours 2 minutes 59 seconds  Estimated Blood Loss:  Estimated blood loss: none.      [facility]

## 2020-05-10 NOTE — Anesthesia Postprocedure Evaluation (Signed)
Anesthesia Post Note  Patient: Alexa Perry  Procedure(s) Performed: COLONOSCOPY WITH BIOPSY (N/A Rectum) ESOPHAGOGASTRODUODENOSCOPY (EGD) WITH BIOPSY (N/A Mouth) POLYPECTOMY (N/A Rectum)     Patient location during evaluation: PACU Anesthesia Type: General Level of consciousness: awake and alert Pain management: pain level controlled Vital Signs Assessment: post-procedure vital signs reviewed and stable Respiratory status: spontaneous breathing Cardiovascular status: stable Anesthetic complications: no    Jaci Standard, III,  Albina Gosney D

## 2020-05-11 ENCOUNTER — Encounter: Payer: Self-pay | Admitting: *Deleted

## 2020-05-11 LAB — SURGICAL PATHOLOGY

## 2020-05-14 ENCOUNTER — Encounter: Payer: Self-pay | Admitting: Gastroenterology

## 2020-09-27 ENCOUNTER — Other Ambulatory Visit: Payer: Self-pay | Admitting: Internal Medicine

## 2020-09-27 DIAGNOSIS — Z1231 Encounter for screening mammogram for malignant neoplasm of breast: Secondary | ICD-10-CM

## 2021-02-07 ENCOUNTER — Other Ambulatory Visit: Payer: Self-pay

## 2021-02-07 ENCOUNTER — Ambulatory Visit
Admission: RE | Admit: 2021-02-07 | Discharge: 2021-02-07 | Disposition: A | Discharge status: Home / Self Care | Payer: Medicare Other | Source: Ambulatory Visit | Attending: Internal Medicine | Admitting: Internal Medicine

## 2021-02-07 DIAGNOSIS — Z1231 Encounter for screening mammogram for malignant neoplasm of breast: Secondary | ICD-10-CM | POA: Insufficient documentation

## 2021-02-14 ENCOUNTER — Other Ambulatory Visit: Payer: Self-pay | Admitting: Internal Medicine

## 2021-02-14 DIAGNOSIS — R928 Other abnormal and inconclusive findings on diagnostic imaging of breast: Secondary | ICD-10-CM

## 2021-02-14 DIAGNOSIS — N6489 Other specified disorders of breast: Secondary | ICD-10-CM

## 2021-02-14 IMAGING — MG DIGITAL SCREENING BILAT W/ TOMO W/ CAD
8 series · 8 of 24 positions shown · non-contrast
Comparison: Previous exam(s).

CLINICAL DATA: Screening.

EXAM:
DIGITAL SCREENING BILATERAL MAMMOGRAM WITH TOMO AND CAD

[R CC synth-2D]
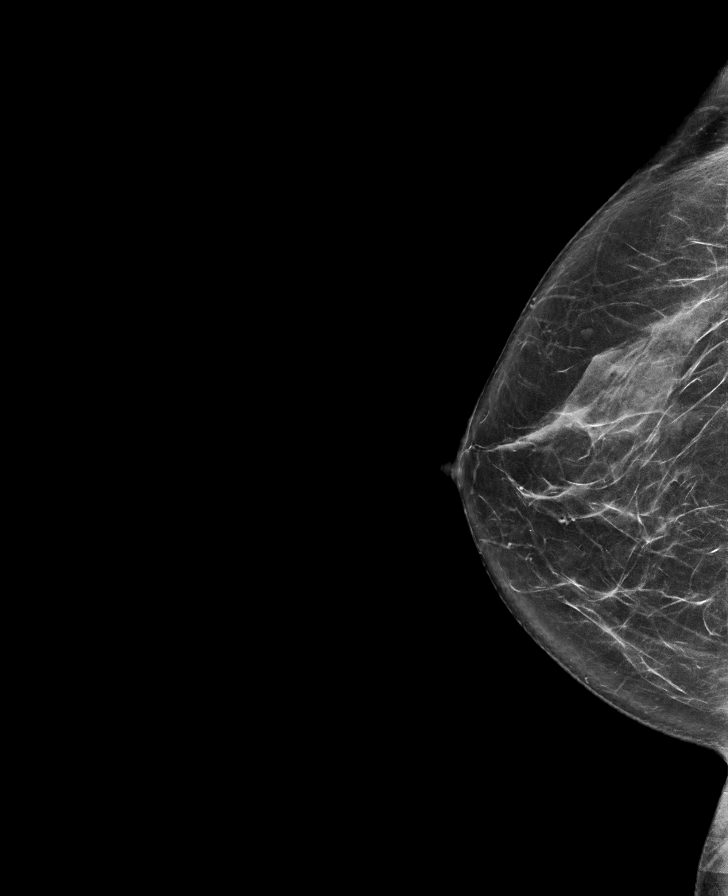

[L MLO synth-2D]
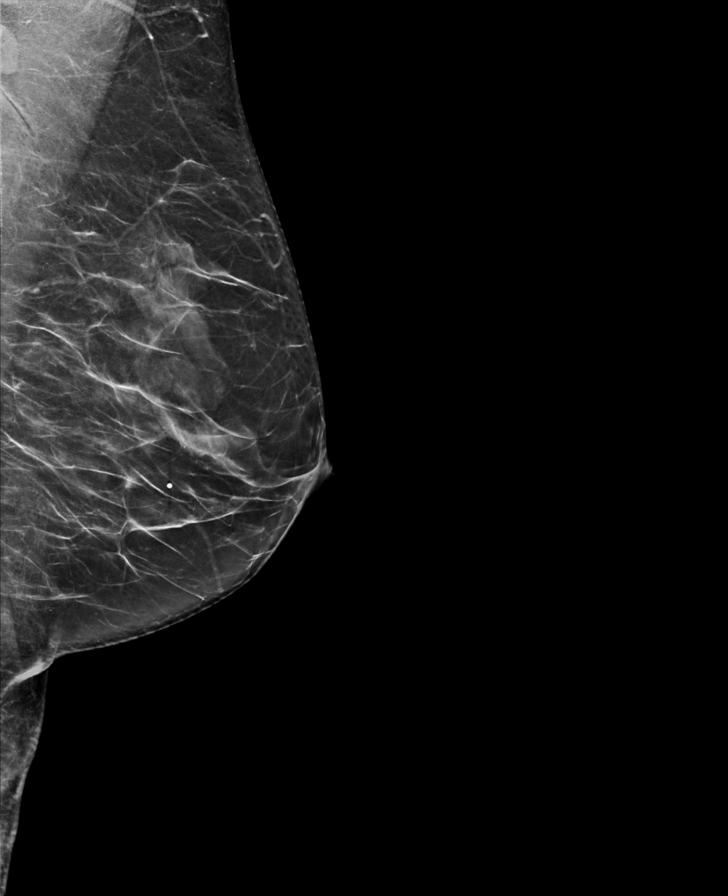

[L CC synth-2D]
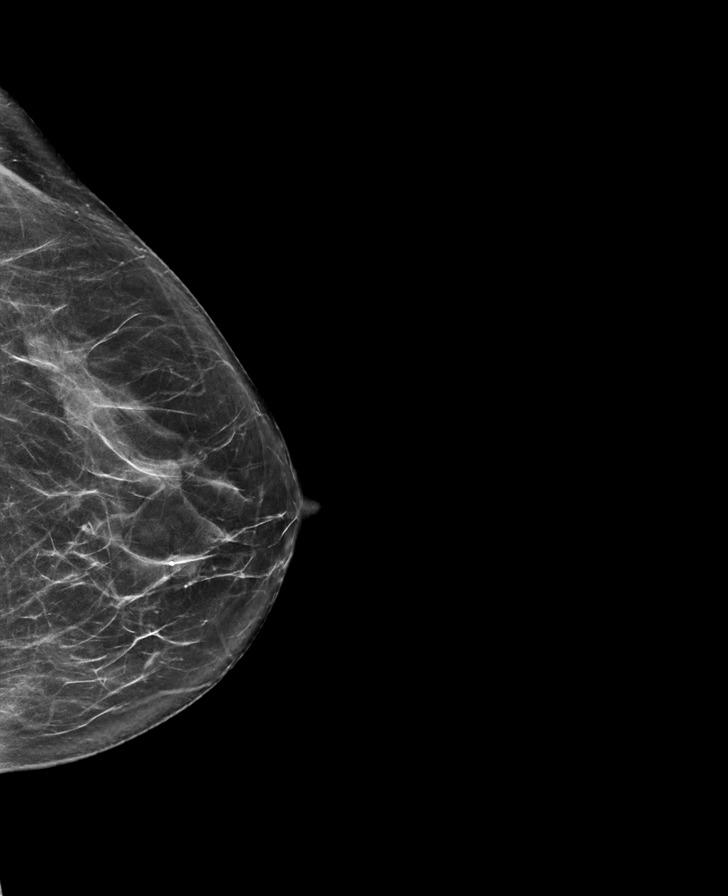

[R MLO synth-2D]
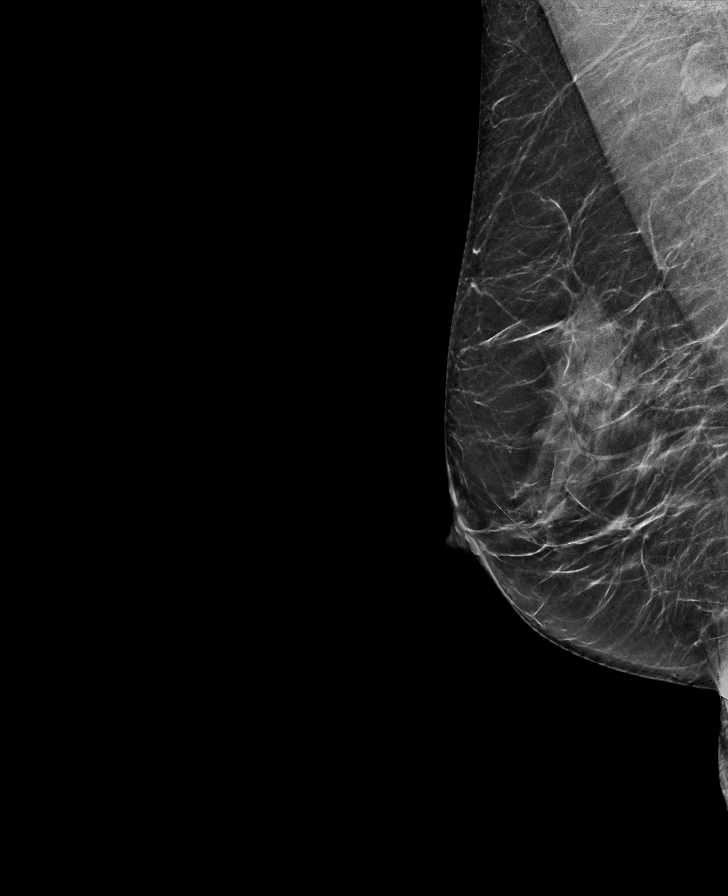

[L MLO tomo · tomo slice 35/70.0]
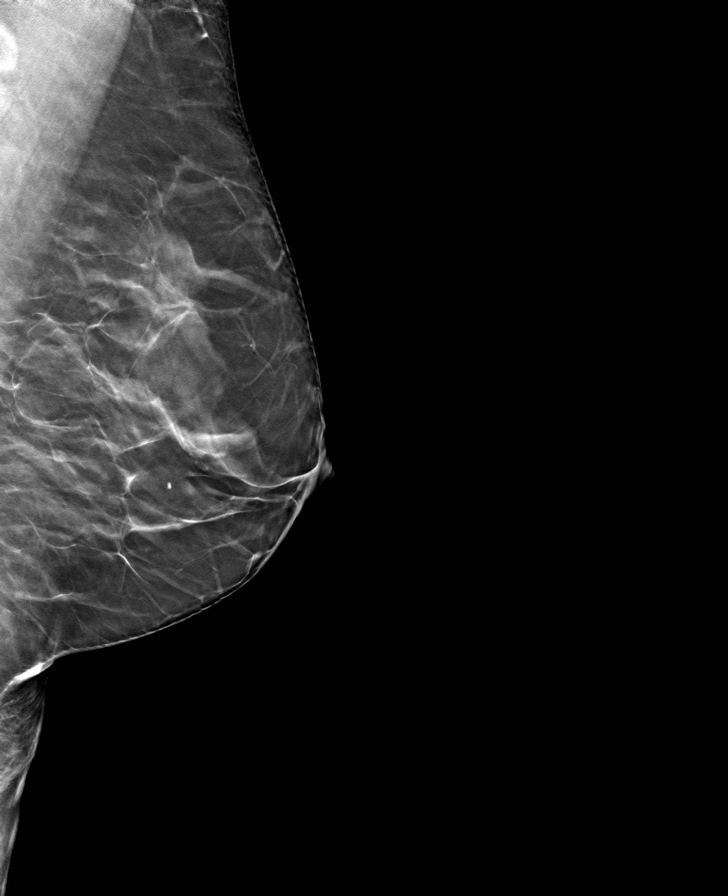

[R MLO tomo · tomo slice 33/65.0]
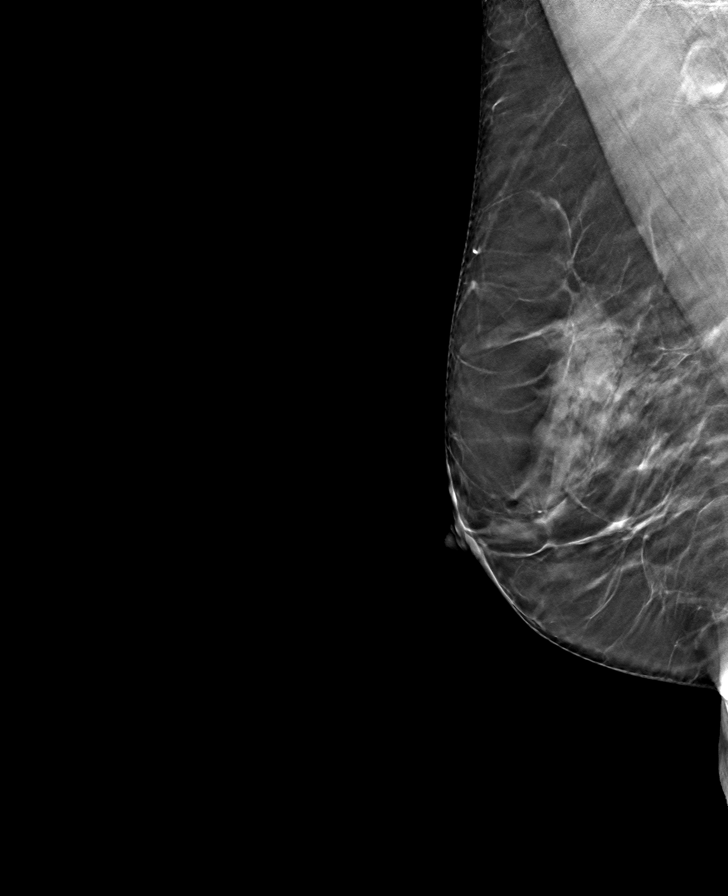

[R CC tomo · tomo slice 36/71.0]
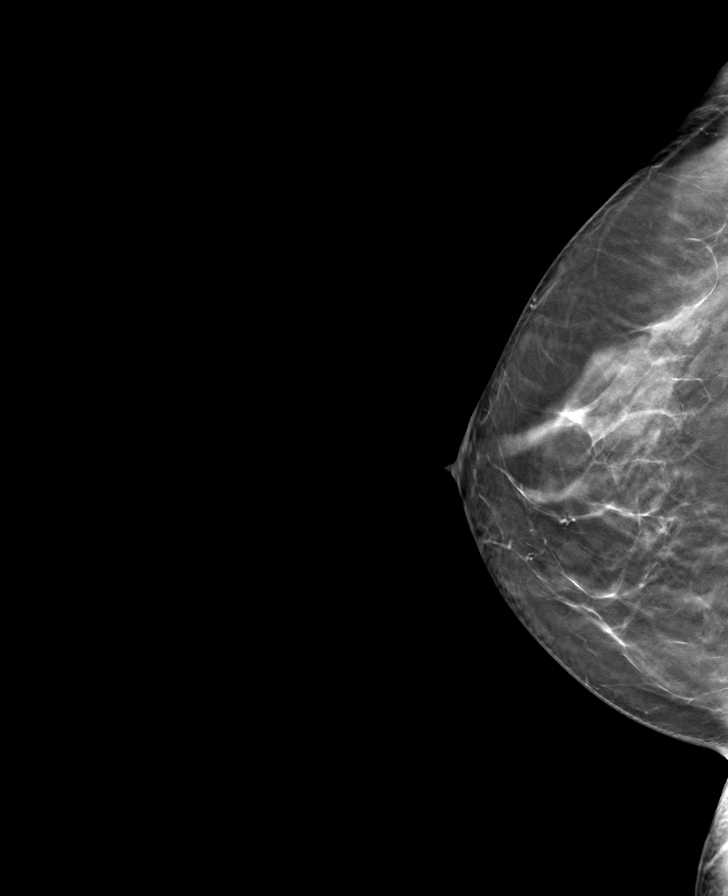

[L CC tomo · tomo slice 35/69.0]
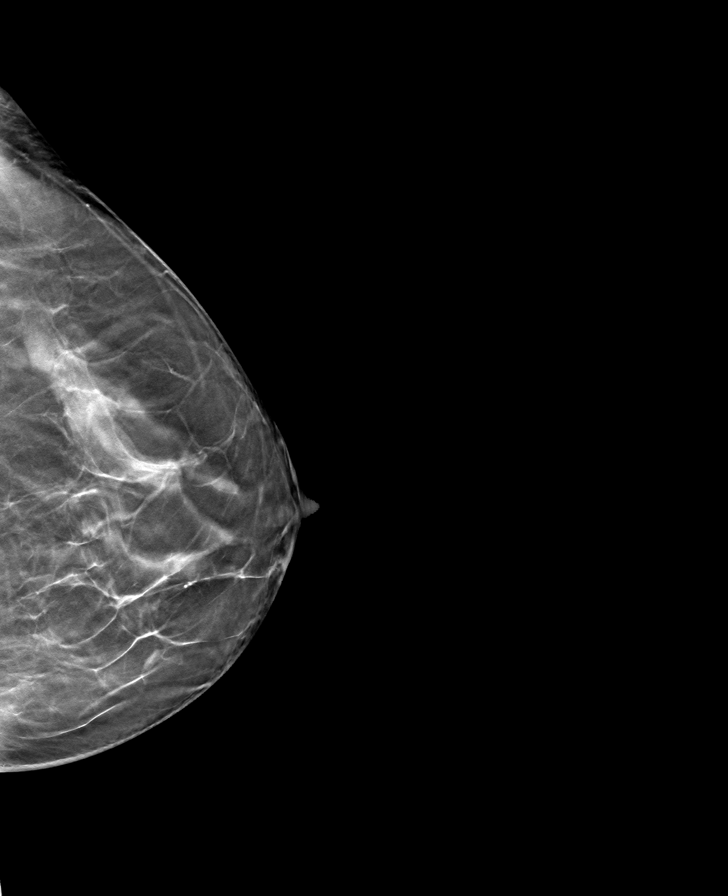

[8 of 24 positions shown; findings below may reference images not displayed]

ACR Breast Density Category b: There are scattered areas of
fibroglandular density.
FINDINGS: There are no findings suspicious for malignancy. Images were
processed with CAD.
IMPRESSION: No mammographic evidence of malignancy. A result letter of this
screening mammogram will be mailed directly to the patient.

RECOMMENDATION:
Screening mammogram in one year. (Code:[id number])

BI-RADS CATEGORY  1: Negative.

## 2021-02-21 ENCOUNTER — Other Ambulatory Visit: Payer: Self-pay

## 2021-02-21 ENCOUNTER — Ambulatory Visit
Admission: RE | Admit: 2021-02-21 | Discharge: 2021-02-21 | Disposition: A | Discharge status: Home / Self Care | Payer: Medicare Other | Source: Ambulatory Visit | Attending: Internal Medicine | Admitting: Internal Medicine

## 2021-02-21 DIAGNOSIS — N6489 Other specified disorders of breast: Secondary | ICD-10-CM

## 2021-02-21 DIAGNOSIS — R928 Other abnormal and inconclusive findings on diagnostic imaging of breast: Secondary | ICD-10-CM | POA: Diagnosis not present

## 2021-02-25 ENCOUNTER — Other Ambulatory Visit: Payer: Self-pay | Admitting: Internal Medicine

## 2021-02-25 DIAGNOSIS — N6489 Other specified disorders of breast: Secondary | ICD-10-CM

## 2021-02-25 DIAGNOSIS — R928 Other abnormal and inconclusive findings on diagnostic imaging of breast: Secondary | ICD-10-CM

## 2021-03-05 ENCOUNTER — Ambulatory Visit
Admission: RE | Admit: 2021-03-05 | Discharge: 2021-03-05 | Disposition: A | Discharge status: Home / Self Care | Payer: Medicare Other | Source: Ambulatory Visit | Attending: Internal Medicine | Admitting: Internal Medicine

## 2021-03-05 ENCOUNTER — Other Ambulatory Visit: Payer: Self-pay

## 2021-03-05 DIAGNOSIS — N6489 Other specified disorders of breast: Secondary | ICD-10-CM | POA: Diagnosis present

## 2021-03-05 DIAGNOSIS — R928 Other abnormal and inconclusive findings on diagnostic imaging of breast: Secondary | ICD-10-CM | POA: Insufficient documentation

## 2021-03-05 HISTORY — PX: BREAST BIOPSY: SHX20

## 2021-03-06 LAB — SURGICAL PATHOLOGY

## 2021-03-27 ENCOUNTER — Other Ambulatory Visit: Payer: Self-pay

## 2021-03-27 ENCOUNTER — Encounter: Payer: Self-pay | Admitting: Ophthalmology

## 2021-04-01 ENCOUNTER — Other Ambulatory Visit: Payer: Self-pay

## 2021-04-01 ENCOUNTER — Other Ambulatory Visit
Admission: RE | Admit: 2021-04-01 | Discharge: 2021-04-01 | Disposition: A | Discharge status: Home / Self Care | Payer: Medicare Other | Source: Ambulatory Visit | Attending: Ophthalmology | Admitting: Ophthalmology

## 2021-04-01 DIAGNOSIS — Z20822 Contact with and (suspected) exposure to covid-19: Secondary | ICD-10-CM | POA: Insufficient documentation

## 2021-04-01 DIAGNOSIS — Z01812 Encounter for preprocedural laboratory examination: Secondary | ICD-10-CM | POA: Diagnosis present

## 2021-04-01 LAB — SARS CORONAVIRUS 2 (TAT 6-24 HRS): SARS Coronavirus 2: NEGATIVE

## 2021-04-01 NOTE — Discharge Instructions (Signed)

## 2021-04-03 ENCOUNTER — Other Ambulatory Visit: Payer: Self-pay

## 2021-04-03 ENCOUNTER — Encounter
Admission: RE | Disposition: A | Discharge status: Home / Self Care | Payer: Self-pay | Source: Home / Self Care | Attending: Ophthalmology

## 2021-04-03 ENCOUNTER — Ambulatory Visit: Payer: Medicare Other | Admitting: Anesthesiology

## 2021-04-03 ENCOUNTER — Encounter: Payer: Self-pay | Admitting: Ophthalmology

## 2021-04-03 ENCOUNTER — Ambulatory Visit
Admission: RE | Admit: 2021-04-03 | Discharge: 2021-04-03 | Disposition: A | Discharge status: Home / Self Care | Payer: Medicare Other | Attending: Ophthalmology | Admitting: Ophthalmology

## 2021-04-03 DIAGNOSIS — Z803 Family history of malignant neoplasm of breast: Secondary | ICD-10-CM | POA: Insufficient documentation

## 2021-04-03 DIAGNOSIS — Z79899 Other long term (current) drug therapy: Secondary | ICD-10-CM | POA: Insufficient documentation

## 2021-04-03 DIAGNOSIS — H2512 Age-related nuclear cataract, left eye: Secondary | ICD-10-CM | POA: Insufficient documentation

## 2021-04-03 DIAGNOSIS — Z85038 Personal history of other malignant neoplasm of large intestine: Secondary | ICD-10-CM | POA: Insufficient documentation

## 2021-04-03 DIAGNOSIS — Z79890 Hormone replacement therapy (postmenopausal): Secondary | ICD-10-CM | POA: Insufficient documentation

## 2021-04-03 HISTORY — PX: CATARACT EXTRACTION W/PHACO: SHX586

## 2021-04-03 SURGERY — PHACOEMULSIFICATION, CATARACT, WITH IOL INSERTION
Anesthesia: Monitor Anesthesia Care | Site: Eye | Laterality: Left

## 2021-04-03 MED ORDER — ONDANSETRON HCL 4 MG/2ML IJ SOLN: 4.0000 mg | Freq: Once | INTRAMUSCULAR | Status: DC | PRN

## 2021-04-03 MED ORDER — TETRACAINE HCL 0.5 % OP SOLN
1.0000 [drp] | OPHTHALMIC | Status: DC | PRN
  Administered 2021-04-03 (×3): 1 [drp] via OPHTHALMIC

## 2021-04-03 MED ORDER — ACETAMINOPHEN 325 MG PO TABS: 325.0000 mg | ORAL_TABLET | ORAL | Status: DC | PRN

## 2021-04-03 MED ORDER — ARMC OPHTHALMIC DILATING DROPS
1.0000 "application " | OPHTHALMIC | Status: DC | PRN
  Administered 2021-04-03 (×3): 1 via OPHTHALMIC

## 2021-04-03 MED ORDER — MIDAZOLAM HCL 2 MG/2ML IJ SOLN
INTRAMUSCULAR | Status: DC | PRN
  Administered 2021-04-03: 2 mg via INTRAVENOUS

## 2021-04-03 MED ORDER — NA HYALUR & NA CHOND-NA HYALUR 0.4-0.35 ML IO KIT
PACK | INTRAOCULAR | Status: DC | PRN
  Administered 2021-04-03: 1 mL via INTRAOCULAR

## 2021-04-03 MED ORDER — EPINEPHRINE PF 1 MG/ML IJ SOLN
INTRAOCULAR | Status: DC | PRN
  Administered 2021-04-03: 42 mL via OPHTHALMIC

## 2021-04-03 MED ORDER — LIDOCAINE HCL (PF) 2 % IJ SOLN
INTRAOCULAR | Status: DC | PRN
  Administered 2021-04-03: 2 mL

## 2021-04-03 MED ORDER — ACETAMINOPHEN 160 MG/5ML PO SOLN: 325.0000 mg | ORAL | Status: DC | PRN

## 2021-04-03 MED ORDER — FENTANYL CITRATE (PF) 100 MCG/2ML IJ SOLN
INTRAMUSCULAR | Status: DC | PRN
  Administered 2021-04-03: 50 ug via INTRAVENOUS

## 2021-04-03 MED ORDER — CEFUROXIME OPHTHALMIC INJECTION 1 MG/0.1 ML
INJECTION | OPHTHALMIC | Status: DC | PRN
  Administered 2021-04-03: 0.1 mL via INTRACAMERAL

## 2021-04-03 MED ORDER — BRIMONIDINE TARTRATE-TIMOLOL 0.2-0.5 % OP SOLN
OPHTHALMIC | Status: DC | PRN
  Administered 2021-04-03: 1 [drp] via OPHTHALMIC

## 2021-04-03 SURGICAL SUPPLY — 20 items
CANNULA ANT/CHMB 27GA (MISCELLANEOUS) ×2 IMPLANT
GLOVE EXAM LX STRL 7.5 (GLOVE) ×2 IMPLANT
GLOVE SURG TRIUMPH 8.0 PF LTX (GLOVE) ×2 IMPLANT
GOWN STRL REUS W/ TWL LRG LVL3 (GOWN DISPOSABLE) ×2 IMPLANT
GOWN STRL REUS W/TWL LRG LVL3 (GOWN DISPOSABLE) ×4
LENS IOL EYHANCE TORIC II 17.5 ×2 IMPLANT
LENS IOL EYHANCE TRC 225 17.5 ×1 IMPLANT
LENS IOL EYHNC TORIC 225 17.5 ×1 IMPLANT
MARKER SKIN DUAL TIP RULER LAB (MISCELLANEOUS) ×2 IMPLANT
NEEDLE CAPSULORHEX 25GA (NEEDLE) ×2 IMPLANT
NEEDLE FILTER BLUNT 18X 1/2SAF (NEEDLE) ×2
NEEDLE FILTER BLUNT 18X1 1/2 (NEEDLE) ×2 IMPLANT
PACK CATARACT BRASINGTON (MISCELLANEOUS) ×2 IMPLANT
PACK EYE AFTER SURG (MISCELLANEOUS) ×2 IMPLANT
PACK OPTHALMIC (MISCELLANEOUS) ×2 IMPLANT
SOLUTION OPHTHALMIC SALT (MISCELLANEOUS) ×2 IMPLANT
SYR 3ML LL SCALE MARK (SYRINGE) ×4 IMPLANT
SYR TB 1ML LUER SLIP (SYRINGE) ×2 IMPLANT
WATER STERILE IRR 250ML POUR (IV SOLUTION) ×2 IMPLANT
WIPE NON LINTING 3.25X3.25 (MISCELLANEOUS) ×2 IMPLANT

## 2021-04-03 NOTE — H&P (Signed)
[facility]   Primary Care Physician:  Idelle Crouch, MD Ophthalmologist: Dr. Leandrew Koyanagi  Pre-Procedure History & Physical: HPI:  Alexa Perry is a 70 y.o. female here for ophthalmic surgery.   Past Medical History:  Diagnosis Date  . Arthritis    Right knee, left hip, lower back  . Colon cancer ([place]) 2010  . GERD (gastroesophageal reflux disease)   . Hypothyroidism   . Osteopenia     Past Surgical History:  Procedure Laterality Date  . BREAST BIOPSY Left 03/05/2021   stereo bx, x-clip, path pending   . BREAST CYST ASPIRATION Left 1990  . COLONOSCOPY WITH PROPOFOL N/A 05/10/2020   Procedure: COLONOSCOPY WITH BIOPSY;  Surgeon: Lucilla Lame, MD;  Location: [place];  Service: Endoscopy;  Laterality: N/A;  priority 3  . ESOPHAGOGASTRODUODENOSCOPY (EGD) WITH PROPOFOL N/A 05/10/2020   Procedure: ESOPHAGOGASTRODUODENOSCOPY (EGD) WITH BIOPSY;  Surgeon: Lucilla Lame, MD;  Location: [place];  Service: Endoscopy;  Laterality: N/A;  . POLYPECTOMY N/A 05/10/2020   Procedure: POLYPECTOMY;  Surgeon: Lucilla Lame, MD;  Location: [place];  Service: Endoscopy;  Laterality: N/A;    Prior to Admission medications   Medication Sig Start Date End Date Taking? Authorizing Provider  Adapalene-Benzoyl Peroxide 0.1-2.5 % gel as needed Apply thinly to face at night 03/30/15  Yes [provider]  cetirizine (ZYRTEC) 10 MG tablet Take by mouth.   Yes [provider]  Cholecalciferol 50 MCG (2000 UT) CAPS Take by mouth.   Yes [provider]  Cyanocobalamin (VITAMIN B-12 PO) Take by mouth daily.   Yes [provider]  Docusate Sodium (DSS) 100 MG CAPS Take by mouth.   Yes [provider]  hydrochlorothiazide (HYDRODIURIL) 25 MG tablet Take 25 mg by mouth daily. 01/09/20  Yes [provider]  levothyroxine (SYNTHROID) 125 MCG tablet PLEASE SEE ATTACHED FOR DETAILED DIRECTIONS 04/13/20  Yes [provider]  Multiple Vitamin (MULTIVITAMIN) tablet Take 1 tablet by mouth daily.   Yes [provider]  naproxen sodium (ALEVE) 220 MG tablet Take by mouth.   Yes [provider]  fluticasone (FLONASE) 50 MCG/ACT nasal spray SMARTSIG:2 Spray(s) Both Nares Every Night Patient not taking: Reported on 03/27/2021 03/28/20   [provider]  omeprazole (PRILOSEC) 40 MG capsule Take 40 mg by mouth daily. Patient not taking: Reported on 03/27/2021 04/15/20   [provider]    Allergies as of 02/06/2021  . (No Known Allergies)    Family History  Problem Relation Age of Onset  . Breast cancer Mother 77    Social History   Socioeconomic History  . Marital status: Married    Spouse name: Not on file  . Number of children: Not on file  . Years of education: Not on file  . Highest education level: Not on file  Occupational History  . Not on file  Tobacco Use  . Smoking status: Never Smoker  . Smokeless tobacco: Never Used  Vaping Use  . Vaping Use: Never used  Substance and Sexual Activity  . Alcohol use: Yes  . Drug use: Never  . Sexual activity: Not on file  Other Topics Concern  . Not on file  Social History Narrative  . Not on file   Social Determinants of Health   Financial Resource Strain: Not on file  Food Insecurity: Not on file  Transportation Needs: Not on file  Physical Activity: Not on file  Stress: Not on file  Social Connections: Not on file  Intimate Partner Violence: Not on file    Review of Systems: See HPI, otherwise negative ROS  Physical Exam: BP 121/81   Pulse 68   Temp (!) 97.3 F (36.3 C) (Temporal)   Resp 20   Ht 5\' 4"  (1.626 m)   Wt 73.6 kg   SpO2 99%   BMI 27.84 kg/m  General:   Alert,  pleasant and cooperative in NAD Head:  Normocephalic and atraumatic. Lungs:  Clear to auscultation.    Heart:  Regular rate and rhythm.   Impression/Plan: Alexa Perry is here for ophthalmic  surgery.  Risks, benefits, limitations, and alternatives regarding ophthalmic surgery have been reviewed with the patient.  Questions have been answered.  All parties agreeable.   Leandrew Koyanagi, MD  04/03/2021, 9:21 AM

## 2021-04-03 NOTE — Transfer of Care (Signed)
Immediate Anesthesia Transfer of Care Note  Patient: Alexa Perry  Procedure(s) Performed: CATARACT EXTRACTION PHACO AND INTRAOCULAR LENS PLACEMENT (IOC) LEFT EYEHANCE TORIC 4.45 00:49.9 8.9% (Left Eye)  Patient Location: PACU  Anesthesia Type: MAC  Level of Consciousness: awake, alert  and patient cooperative  Airway and Oxygen Therapy: Patient Spontanous Breathing and Patient connected to supplemental oxygen  Post-op Assessment: Post-op Vital signs reviewed, Patient's Cardiovascular Status Stable, Respiratory Function Stable, Patent Airway and No signs of Nausea or vomiting  Post-op Vital Signs: Reviewed and stable  Complications: No complications documented.

## 2021-04-03 NOTE — Anesthesia Preprocedure Evaluation (Signed)
Anesthesia Evaluation  Patient identified by MRN, date of birth, ID band Patient awake    Reviewed: Allergy & Precautions, H&P , NPO status   Airway Mallampati: II  TM Distance: >3 FB Neck ROM: full    Dental no notable dental hx.    Pulmonary asthma ,    Pulmonary exam normal        Cardiovascular hypertension,  Rhythm:regular Rate:Normal     Neuro/Psych    GI/Hepatic   Endo/Other  Hypothyroidism   Renal/GU      Musculoskeletal  (+) Arthritis ,   Abdominal   Peds  Hematology   Anesthesia Other Findings   Reproductive/Obstetrics                             Anesthesia Physical  Anesthesia Plan  ASA: II  Anesthesia Plan: MAC   Post-op Pain Management:    Induction:   PONV Risk Score and Plan: 2 and Midazolam  Airway Management Planned: Nasal Cannula  Additional Equipment:   Intra-op Plan:   Post-operative Plan:   Informed Consent: I have reviewed the patients History and Physical, chart, labs and discussed the procedure including the risks, benefits and alternatives for the proposed anesthesia with the patient or authorized representative who has indicated his/her understanding and acceptance.       Plan Discussed with: CRNA  Anesthesia Plan Comments:         Anesthesia Quick Evaluation

## 2021-04-03 NOTE — Op Note (Signed)
LOCATION:  [place]   PREOPERATIVE DIAGNOSIS:  Nuclear sclerotic cataract of the left eye.  H25.12  POSTOPERATIVE DIAGNOSIS:  Nuclear sclerotic cataract of the left eye.   PROCEDURE:  Phacoemulsification with Toric posterior chamber intraocular lens placement of the left eye.  Ultrasound time: Procedure(s): CATARACT EXTRACTION PHACO AND INTRAOCULAR LENS PLACEMENT (IOC) LEFT EYEHANCE TORIC 4.45 00:49.9 8.9% (Left) LENS: [id number] 17.5 D eyhance Toric intraocular lens with 2.25 diopters of cylindrical power with axis orientation at 70 degrees.     SURGEON:  Wyonia Hough, MD   ANESTHESIA:  Topical with tetracaine drops and 2% Xylocaine jelly, augmented with 1% preservative-free intracameral lidocaine.  COMPLICATIONS:  None.   DESCRIPTION OF PROCEDURE:  The patient was identified in the holding room and transported to the operating suite and placed in the supine position under the operating microscope.  The left eye was identified as the operative eye, and it was prepped and draped in the usual sterile ophthalmic fashion.    A clear-corneal paracentesis incision was made at the 1:30 position.  0.5 ml of preservative-free 1% lidocaine was injected into the anterior chamber. The anterior chamber was filled with Viscoat.  A 2.4 millimeter near clear corneal incision was then made at the 10:30 position.  A cystotome and capsulorrhexis forceps were then used to make a curvilinear capsulorrhexis.  Hydrodissection and hydrodelineation were then performed using balanced salt solution.   Phacoemulsification was then used in stop and chop fashion to remove the lens, nucleus and epinucleus.  The remaining cortex was aspirated using the irrigation and aspiration handpiece.  Provisc viscoelastic was then placed into the capsular bag to distend it for lens placement.  The Verion digital marker was used to align the implant at the intended axis.   A 17.5 diopter lens was then injected into  the capsular bag.  It was rotated clockwise until the axis marks on the lens were approximately 15 degrees in the counterclockwise direction to the intended alignment.  The viscoelastic was aspirated from the eye using the irrigation aspiration handpiece.  Then, a Koch spatula through the sideport incision was used to rotate the lens in a clockwise direction until the axis markings of the intraocular lens were lined up with the Verion alignment.  Balanced salt solution was then used to hydrate the wounds. Cefuroxime 0.1 ml of a 10mg /ml solution was injected into the anterior chamber for a dose of 1 mg of intracameral antibiotic at the completion of the case.    The eye was noted to have a physiologic pressure and there was no wound leak noted.   Timolol and Brimonidine drops were applied to the eye.  The patient was taken to the recovery room in stable condition having had no complications of anesthesia or surgery.  Bennet Kujawa 04/03/2021, 10:47 AM

## 2021-04-03 NOTE — Anesthesia Procedure Notes (Signed)
Procedure Name: MAC Date/Time: 04/03/2021 10:30 AM Performed by: Silvana Newness, CRNA Pre-anesthesia Checklist: Patient identified, Emergency Drugs available, Suction available, Patient being monitored and Timeout performed Patient Re-evaluated:Patient Re-evaluated prior to induction Oxygen Delivery Method: Nasal cannula Placement Confirmation: positive ETCO2

## 2021-04-03 NOTE — Anesthesia Postprocedure Evaluation (Signed)
Anesthesia Post Note  Patient: Alexa Perry  Procedure(s) Performed: CATARACT EXTRACTION PHACO AND INTRAOCULAR LENS PLACEMENT (IOC) LEFT EYEHANCE TORIC 4.45 00:49.9 8.9% (Left Eye)     Patient location during evaluation: PACU Anesthesia Type: MAC Level of consciousness: awake Pain management: pain level controlled Vital Signs Assessment: post-procedure vital signs reviewed and stable Respiratory status: respiratory function stable Cardiovascular status: stable Postop Assessment: no apparent nausea or vomiting Anesthetic complications: no   No complications documented.  Veda Canning

## 2021-04-04 ENCOUNTER — Encounter: Payer: Self-pay | Admitting: Ophthalmology

## 2021-04-09 ENCOUNTER — Encounter: Payer: Self-pay | Admitting: Ophthalmology

## 2021-04-15 NOTE — Discharge Instructions (Signed)

## 2021-04-17 ENCOUNTER — Ambulatory Visit: Payer: Medicare Other | Admitting: Anesthesiology

## 2021-04-17 ENCOUNTER — Encounter: Payer: Self-pay | Admitting: Ophthalmology

## 2021-04-17 ENCOUNTER — Encounter
Admission: RE | Disposition: A | Discharge status: Home / Self Care | Payer: Self-pay | Source: Home / Self Care | Attending: Ophthalmology

## 2021-04-17 ENCOUNTER — Ambulatory Visit
Admission: RE | Admit: 2021-04-17 | Discharge: 2021-04-17 | Disposition: A | Discharge status: Home / Self Care | Payer: Medicare Other | Attending: Ophthalmology | Admitting: Ophthalmology

## 2021-04-17 DIAGNOSIS — Z79899 Other long term (current) drug therapy: Secondary | ICD-10-CM | POA: Insufficient documentation

## 2021-04-17 DIAGNOSIS — H2511 Age-related nuclear cataract, right eye: Secondary | ICD-10-CM | POA: Insufficient documentation

## 2021-04-17 DIAGNOSIS — Z79890 Hormone replacement therapy (postmenopausal): Secondary | ICD-10-CM | POA: Insufficient documentation

## 2021-04-17 DIAGNOSIS — Z9842 Cataract extraction status, left eye: Secondary | ICD-10-CM | POA: Diagnosis not present

## 2021-04-17 DIAGNOSIS — Z961 Presence of intraocular lens: Secondary | ICD-10-CM | POA: Insufficient documentation

## 2021-04-17 HISTORY — PX: CATARACT EXTRACTION W/PHACO: SHX586

## 2021-04-17 SURGERY — PHACOEMULSIFICATION, CATARACT, WITH IOL INSERTION
Anesthesia: Monitor Anesthesia Care | Site: Eye | Laterality: Right

## 2021-04-17 MED ORDER — CEFUROXIME OPHTHALMIC INJECTION 1 MG/0.1 ML
INJECTION | OPHTHALMIC | Status: DC | PRN
  Administered 2021-04-17: 0.1 mL via INTRACAMERAL

## 2021-04-17 MED ORDER — ACETAMINOPHEN 325 MG PO TABS: 325.0000 mg | ORAL_TABLET | Freq: Once | ORAL | Status: DC

## 2021-04-17 MED ORDER — LIDOCAINE HCL (PF) 2 % IJ SOLN
INTRAOCULAR | Status: DC | PRN
  Administered 2021-04-17: 1 mL

## 2021-04-17 MED ORDER — FENTANYL CITRATE (PF) 100 MCG/2ML IJ SOLN
INTRAMUSCULAR | Status: DC | PRN
  Administered 2021-04-17: 100 ug via INTRAVENOUS

## 2021-04-17 MED ORDER — EPINEPHRINE PF 1 MG/ML IJ SOLN
INTRAOCULAR | Status: DC | PRN
  Administered 2021-04-17: 33 mL via OPHTHALMIC

## 2021-04-17 MED ORDER — ARMC OPHTHALMIC DILATING DROPS
1.0000 "application " | OPHTHALMIC | Status: DC | PRN
  Administered 2021-04-17 (×3): 1 via OPHTHALMIC

## 2021-04-17 MED ORDER — NA HYALUR & NA CHOND-NA HYALUR 0.4-0.35 ML IO KIT
PACK | INTRAOCULAR | Status: DC | PRN
  Administered 2021-04-17: 1 mL via INTRAOCULAR

## 2021-04-17 MED ORDER — TETRACAINE HCL 0.5 % OP SOLN
1.0000 [drp] | OPHTHALMIC | Status: DC | PRN
  Administered 2021-04-17 (×3): 1 [drp] via OPHTHALMIC

## 2021-04-17 MED ORDER — MIDAZOLAM HCL 2 MG/2ML IJ SOLN
INTRAMUSCULAR | Status: DC | PRN
  Administered 2021-04-17: 2 mg via INTRAVENOUS

## 2021-04-17 MED ORDER — LACTATED RINGERS IV SOLN: INTRAVENOUS | Status: DC

## 2021-04-17 MED ORDER — ACETAMINOPHEN 160 MG/5ML PO SOLN: 325.0000 mg | Freq: Once | ORAL | Status: DC

## 2021-04-17 MED ORDER — BRIMONIDINE TARTRATE-TIMOLOL 0.2-0.5 % OP SOLN
OPHTHALMIC | Status: DC | PRN
  Administered 2021-04-17: 1 [drp] via OPHTHALMIC

## 2021-04-17 SURGICAL SUPPLY — 24 items
CANNULA ANT/CHMB 27GA (MISCELLANEOUS) ×2 IMPLANT
GLOVE SURG ENC TEXT LTX SZ7.5 (GLOVE) ×4 IMPLANT
GLOVE SURG TRIUMPH 8.0 PF LTX (GLOVE) ×2 IMPLANT
GOWN STRL REUS W/ TWL LRG LVL3 (GOWN DISPOSABLE) ×2 IMPLANT
GOWN STRL REUS W/TWL LRG LVL3 (GOWN DISPOSABLE) ×4
LENS IOL TECNIS EYHANCE 18.5 (Intraocular Lens) ×2 IMPLANT
MARKER SKIN DUAL TIP RULER LAB (MISCELLANEOUS) ×2 IMPLANT
NDL RETROBULBAR .5 NSTRL (NEEDLE) IMPLANT
NEEDLE CAPSULORHEX 25GA (NEEDLE) ×2 IMPLANT
NEEDLE FILTER BLUNT 18X 1/2SAF (NEEDLE) ×2
NEEDLE FILTER BLUNT 18X1 1/2 (NEEDLE) ×2 IMPLANT
PACK CATARACT BRASINGTON (MISCELLANEOUS) ×2 IMPLANT
PACK EYE AFTER SURG (MISCELLANEOUS) ×2 IMPLANT
PACK OPTHALMIC (MISCELLANEOUS) ×2 IMPLANT
RING MALYGIN 7.0 (MISCELLANEOUS) IMPLANT
SOLUTION OPHTHALMIC SALT (MISCELLANEOUS) ×2 IMPLANT
SUT ETHILON 10-0 CS-B-6CS-B-6 (SUTURE)
SUT VICRYL  9 0 (SUTURE)
SUT VICRYL 9 0 (SUTURE) IMPLANT
SUTURE EHLN 10-0 CS-B-6CS-B-6 (SUTURE) IMPLANT
SYR 3ML LL SCALE MARK (SYRINGE) ×4 IMPLANT
SYR TB 1ML LUER SLIP (SYRINGE) ×2 IMPLANT
WATER STERILE IRR 250ML POUR (IV SOLUTION) ×2 IMPLANT
WIPE NON LINTING 3.25X3.25 (MISCELLANEOUS) ×2 IMPLANT

## 2021-04-17 NOTE — Op Note (Signed)
LOCATION:  [place]   PREOPERATIVE DIAGNOSIS:    Nuclear sclerotic cataract right eye. H25.11   POSTOPERATIVE DIAGNOSIS:  Nuclear sclerotic cataract right eye.     PROCEDURE:  Phacoemusification with posterior chamber intraocular lens placement of the right eye   ULTRASOUND TIME: Procedure(s) with comments: CATARACT EXTRACTION PHACO AND INTRAOCULAR LENS PLACEMENT (IOC) RIGHT (Right) - 2.70 0:33.1 8.2%  LENS:   Implant Name Type Inv. Item Serial No. Manufacturer Lot No. LRB No. Used Action  LENS IOL TECNIS EYHANCE 18.5 - [id number] Intraocular Lens LENS IOL TECNIS EYHANCE 18.5 [id number] JOHNSON   Right 1 Implanted         SURGEON:  Wyonia Hough, MD   ANESTHESIA:  Topical with tetracaine drops and 2% Xylocaine jelly, augmented with 1% preservative-free intracameral lidocaine.    COMPLICATIONS:  None.   DESCRIPTION OF PROCEDURE:  The patient was identified in the holding room and transported to the operating room and placed in the supine position under the operating microscope.  The right eye was identified as the operative eye and it was prepped and draped in the usual sterile ophthalmic fashion.   A 1 millimeter clear-corneal paracentesis was made at the 12:00 position.  0.5 ml of preservative-free 1% lidocaine was injected into the anterior chamber. The anterior chamber was filled with Viscoat viscoelastic.  A 2.4 millimeter keratome was used to make a near-clear corneal incision at the 9:00 position.  A curvilinear capsulorrhexis was made with a cystotome and capsulorrhexis forceps.  Balanced salt solution was used to hydrodissect and hydrodelineate the nucleus.   Phacoemulsification was then used in stop and chop fashion to remove the lens nucleus and epinucleus.  The remaining cortex was then removed using the irrigation and aspiration handpiece. Provisc was then placed into the capsular bag to distend it for lens placement.  A lens was then injected into  the capsular bag.  The remaining viscoelastic was aspirated.   Wounds were hydrated with balanced salt solution.  The anterior chamber was inflated to a physiologic pressure with balanced salt solution.  No wound leaks were noted. Cefuroxime 0.1 ml of a 10mg /ml solution was injected into the anterior chamber for a dose of 1 mg of intracameral antibiotic at the completion of the case.   Timolol and Brimonidine drops were applied to the eye.  The patient was taken to the recovery room in stable condition without complications of anesthesia or surgery.   Marlinda Miranda 04/17/2021, 11:02 AM

## 2021-04-17 NOTE — Anesthesia Procedure Notes (Signed)
Procedure Name: MAC Date/Time: 04/17/2021 10:50 AM Performed by: Silvana Newness, CRNA Pre-anesthesia Checklist: Patient identified, Emergency Drugs available, Suction available, Patient being monitored and Timeout performed Patient Re-evaluated:Patient Re-evaluated prior to induction Oxygen Delivery Method: Nasal cannula Placement Confirmation: positive ETCO2

## 2021-04-17 NOTE — Anesthesia Postprocedure Evaluation (Signed)
Anesthesia Post Note  Patient: Alexa Perry  Procedure(s) Performed: CATARACT EXTRACTION PHACO AND INTRAOCULAR LENS PLACEMENT (IOC) RIGHT (Right Eye)     Patient location during evaluation: PACU Anesthesia Type: MAC Level of consciousness: awake and alert and oriented Pain management: satisfactory to patient Vital Signs Assessment: post-procedure vital signs reviewed and stable Respiratory status: spontaneous breathing, nonlabored ventilation and respiratory function stable Cardiovascular status: blood pressure returned to baseline and stable Postop Assessment: Adequate PO intake and No signs of nausea or vomiting Anesthetic complications: no   No complications documented.  Raliegh Ip

## 2021-04-17 NOTE — Anesthesia Preprocedure Evaluation (Signed)
Anesthesia Evaluation  Patient identified by MRN, date of birth, ID band Patient awake    Reviewed: Allergy & Precautions, H&P , NPO status , Patient's Chart, lab work & pertinent test results  Airway Mallampati: II  TM Distance: >3 FB Neck ROM: full    Dental no notable dental hx.    Pulmonary asthma ,    Pulmonary exam normal breath sounds clear to auscultation       Cardiovascular hypertension, Normal cardiovascular exam Rhythm:regular Rate:Normal     Neuro/Psych    GI/Hepatic GERD  ,  Endo/Other  Hypothyroidism   Renal/GU      Musculoskeletal  (+) Arthritis ,   Abdominal   Peds  Hematology   Anesthesia Other Findings   Reproductive/Obstetrics                             Anesthesia Physical  Anesthesia Plan  ASA: II  Anesthesia Plan: MAC   Post-op Pain Management:    Induction:   PONV Risk Score and Plan: 2 and Treatment may vary due to age or medical condition, TIVA and Midazolam  Airway Management Planned: Nasal Cannula  Additional Equipment:   Intra-op Plan:   Post-operative Plan:   Informed Consent: I have reviewed the patients History and Physical, chart, labs and discussed the procedure including the risks, benefits and alternatives for the proposed anesthesia with the patient or authorized representative who has indicated his/her understanding and acceptance.     Dental Advisory Given  Plan Discussed with: CRNA  Anesthesia Plan Comments:         Anesthesia Quick Evaluation

## 2021-04-17 NOTE — H&P (Signed)
[facility]   Primary Care Physician:  Idelle Crouch, MD Ophthalmologist: Dr. Leandrew Koyanagi  Pre-Procedure History & Physical: HPI:  Alexa Perry is a 70 y.o. female here for ophthalmic surgery.   Past Medical History:  Diagnosis Date  . Arthritis    Right knee, left hip, lower back  . Colon cancer ([place]) 2010  . GERD (gastroesophageal reflux disease)   . Hypothyroidism   . Osteopenia     Past Surgical History:  Procedure Laterality Date  . BREAST BIOPSY Left 03/05/2021   stereo bx, x-clip, path pending   . BREAST CYST ASPIRATION Left 1990  . CATARACT EXTRACTION W/PHACO Left 04/03/2021   Procedure: CATARACT EXTRACTION PHACO AND INTRAOCULAR LENS PLACEMENT (IOC) LEFT EYEHANCE TORIC 4.45 00:49.9 8.9%;  Surgeon: Leandrew Koyanagi, MD;  Location: [place];  Service: Ophthalmology;  Laterality: Left;  . COLONOSCOPY WITH PROPOFOL N/A 05/10/2020   Procedure: COLONOSCOPY WITH BIOPSY;  Surgeon: Lucilla Lame, MD;  Location: [place];  Service: Endoscopy;  Laterality: N/A;  priority 3  . ESOPHAGOGASTRODUODENOSCOPY (EGD) WITH PROPOFOL N/A 05/10/2020   Procedure: ESOPHAGOGASTRODUODENOSCOPY (EGD) WITH BIOPSY;  Surgeon: Lucilla Lame, MD;  Location: [place];  Service: Endoscopy;  Laterality: N/A;  . POLYPECTOMY N/A 05/10/2020   Procedure: POLYPECTOMY;  Surgeon: Lucilla Lame, MD;  Location: [place];  Service: Endoscopy;  Laterality: N/A;    Prior to Admission medications   Medication Sig Start Date End Date Taking? Authorizing Provider  Adapalene-Benzoyl Peroxide 0.1-2.5 % gel as needed Apply thinly to face at night 03/30/15  Yes [provider]  cetirizine (ZYRTEC) 10 MG tablet Take by mouth.   Yes [provider]  Cholecalciferol 50 MCG (2000 UT) CAPS Take by mouth.   Yes [provider]  Cyanocobalamin (VITAMIN B-12 PO) Take by mouth daily.   Yes [provider]  Docusate Sodium (DSS) 100 MG  CAPS Take by mouth.   Yes [provider]  fluticasone Asencion Islam) 50 MCG/ACT nasal spray  03/28/20  Yes [provider]  hydrochlorothiazide (HYDRODIURIL) 25 MG tablet Take 25 mg by mouth daily. 01/09/20  Yes [provider]  levothyroxine (SYNTHROID) 125 MCG tablet PLEASE SEE ATTACHED FOR DETAILED DIRECTIONS 04/13/20  Yes [provider]  Multiple Vitamin (MULTIVITAMIN) tablet Take 1 tablet by mouth daily.   Yes [provider]  naproxen sodium (ALEVE) 220 MG tablet Take by mouth.   Yes [provider]  omeprazole (PRILOSEC) 40 MG capsule Take 40 mg by mouth daily. 04/15/20  Yes [provider]    Allergies as of 02/06/2021  . (No Known Allergies)    Family History  Problem Relation Age of Onset  . Breast cancer Mother 61    Social History   Socioeconomic History  . Marital status: Married    Spouse name: Not on file  . Number of children: Not on file  . Years of education: Not on file  . Highest education level: Not on file  Occupational History  . Not on file  Tobacco Use  . Smoking status: Never Smoker  . Smokeless tobacco: Never Used  Vaping Use  . Vaping Use: Never used  Substance and Sexual Activity  . Alcohol use: Yes  . Drug use: Never  . Sexual activity: Not on file  Other Topics Concern  . Not on file  Social History Narrative  . Not on file   Social Determinants of Health   Financial Resource Strain: Not on file  Food Insecurity: Not on file  Transportation Needs: Not on file  Physical Activity: Not on file  Stress: Not on file  Social Connections: Not on file  Intimate Partner Violence: Not on file    Review of Systems: See HPI, otherwise negative ROS  Physical Exam: BP 131/78   Pulse 68   Temp (!) 97.1 F (36.2 C)   Resp 16   Ht 5\' 4"  (1.626 m)   Wt 73.5 kg   SpO2 100%   BMI 27.81 kg/m  General:   Alert,  pleasant and cooperative in NAD Head:  Normocephalic and  atraumatic. Lungs:  Clear to auscultation.    Heart:  Regular rate and rhythm.   Impression/Plan: Alexa Perry is here for ophthalmic surgery.  Risks, benefits, limitations, and alternatives regarding ophthalmic surgery have been reviewed with the patient.  Questions have been answered.  All parties agreeable.   Leandrew Koyanagi, MD  04/17/2021, 10:21 AM

## 2021-04-17 NOTE — Transfer of Care (Signed)
Immediate Anesthesia Transfer of Care Note  Patient: Alexa Perry  Procedure(s) Performed: CATARACT EXTRACTION PHACO AND INTRAOCULAR LENS PLACEMENT (IOC) RIGHT (Right Eye)  Patient Location: PACU  Anesthesia Type: MAC  Level of Consciousness: awake, alert  and patient cooperative  Airway and Oxygen Therapy: Patient Spontanous Breathing and Patient connected to supplemental oxygen  Post-op Assessment: Post-op Vital signs reviewed, Patient's Cardiovascular Status Stable, Respiratory Function Stable, Patent Airway and No signs of Nausea or vomiting  Post-op Vital Signs: Reviewed and stable  Complications: No complications documented.

## 2021-04-18 ENCOUNTER — Encounter: Payer: Self-pay | Admitting: Ophthalmology

## 2021-06-06 ENCOUNTER — Other Ambulatory Visit: Payer: Self-pay | Admitting: Internal Medicine

## 2021-06-06 DIAGNOSIS — N6320 Unspecified lump in the left breast, unspecified quadrant: Secondary | ICD-10-CM

## 2021-09-10 ENCOUNTER — Ambulatory Visit
Admission: RE | Admit: 2021-09-10 | Discharge: 2021-09-10 | Disposition: A | Discharge status: Home / Self Care | Payer: Medicare Other | Source: Ambulatory Visit | Attending: Internal Medicine | Admitting: Internal Medicine

## 2021-09-10 ENCOUNTER — Other Ambulatory Visit: Payer: Self-pay

## 2021-09-10 DIAGNOSIS — N6320 Unspecified lump in the left breast, unspecified quadrant: Secondary | ICD-10-CM

## 2021-09-10 DIAGNOSIS — D242 Benign neoplasm of left breast: Secondary | ICD-10-CM | POA: Insufficient documentation

## 2021-12-24 ENCOUNTER — Other Ambulatory Visit: Payer: Self-pay | Admitting: Internal Medicine

## 2021-12-24 DIAGNOSIS — Z1231 Encounter for screening mammogram for malignant neoplasm of breast: Secondary | ICD-10-CM

## 2022-02-10 ENCOUNTER — Other Ambulatory Visit: Payer: Self-pay

## 2022-02-10 ENCOUNTER — Ambulatory Visit
Admission: RE | Admit: 2022-02-10 | Discharge: 2022-02-10 | Disposition: A | Discharge status: Home / Self Care | Payer: Medicare Other | Source: Ambulatory Visit | Attending: Internal Medicine | Admitting: Internal Medicine

## 2022-02-10 DIAGNOSIS — Z1231 Encounter for screening mammogram for malignant neoplasm of breast: Secondary | ICD-10-CM | POA: Diagnosis present

## 2022-02-14 ENCOUNTER — Other Ambulatory Visit: Payer: Self-pay | Admitting: Internal Medicine

## 2022-02-14 DIAGNOSIS — R928 Other abnormal and inconclusive findings on diagnostic imaging of breast: Secondary | ICD-10-CM

## 2022-02-26 ENCOUNTER — Ambulatory Visit
Admission: RE | Admit: 2022-02-26 | Discharge: 2022-02-26 | Disposition: A | Discharge status: Home / Self Care | Payer: Medicare Other | Source: Ambulatory Visit | Attending: Internal Medicine | Admitting: Internal Medicine

## 2022-02-26 ENCOUNTER — Other Ambulatory Visit: Payer: Self-pay

## 2022-02-26 DIAGNOSIS — R928 Other abnormal and inconclusive findings on diagnostic imaging of breast: Secondary | ICD-10-CM | POA: Diagnosis not present

## 2022-02-28 ENCOUNTER — Other Ambulatory Visit: Payer: Self-pay | Admitting: Internal Medicine

## 2022-02-28 DIAGNOSIS — R928 Other abnormal and inconclusive findings on diagnostic imaging of breast: Secondary | ICD-10-CM

## 2022-02-28 DIAGNOSIS — N63 Unspecified lump in unspecified breast: Secondary | ICD-10-CM

## 2022-03-02 IMAGING — MG MM DIGITAL DIAGNOSTIC UNILAT*L* W/ TOMO W/ CAD
4 series · 4 of 12 positions shown · non-contrast
Comparison: Previous exam(s).

CLINICAL DATA: Left breast asymmetry seen on most recent screening
mammography.

EXAM:
DIGITAL DIAGNOSTIC UNILATERAL LEFT MAMMOGRAM WITH TOMOSYNTHESIS AND
CAD; ULTRASOUND LEFT BREAST LIMITED
TECHNIQUE: Left digital diagnostic mammography and breast tomosynthesis was
performed. The images were evaluated with computer-aided detection.;
Targeted ultrasound examination of the left breast was performed

[L CC synth-2D]
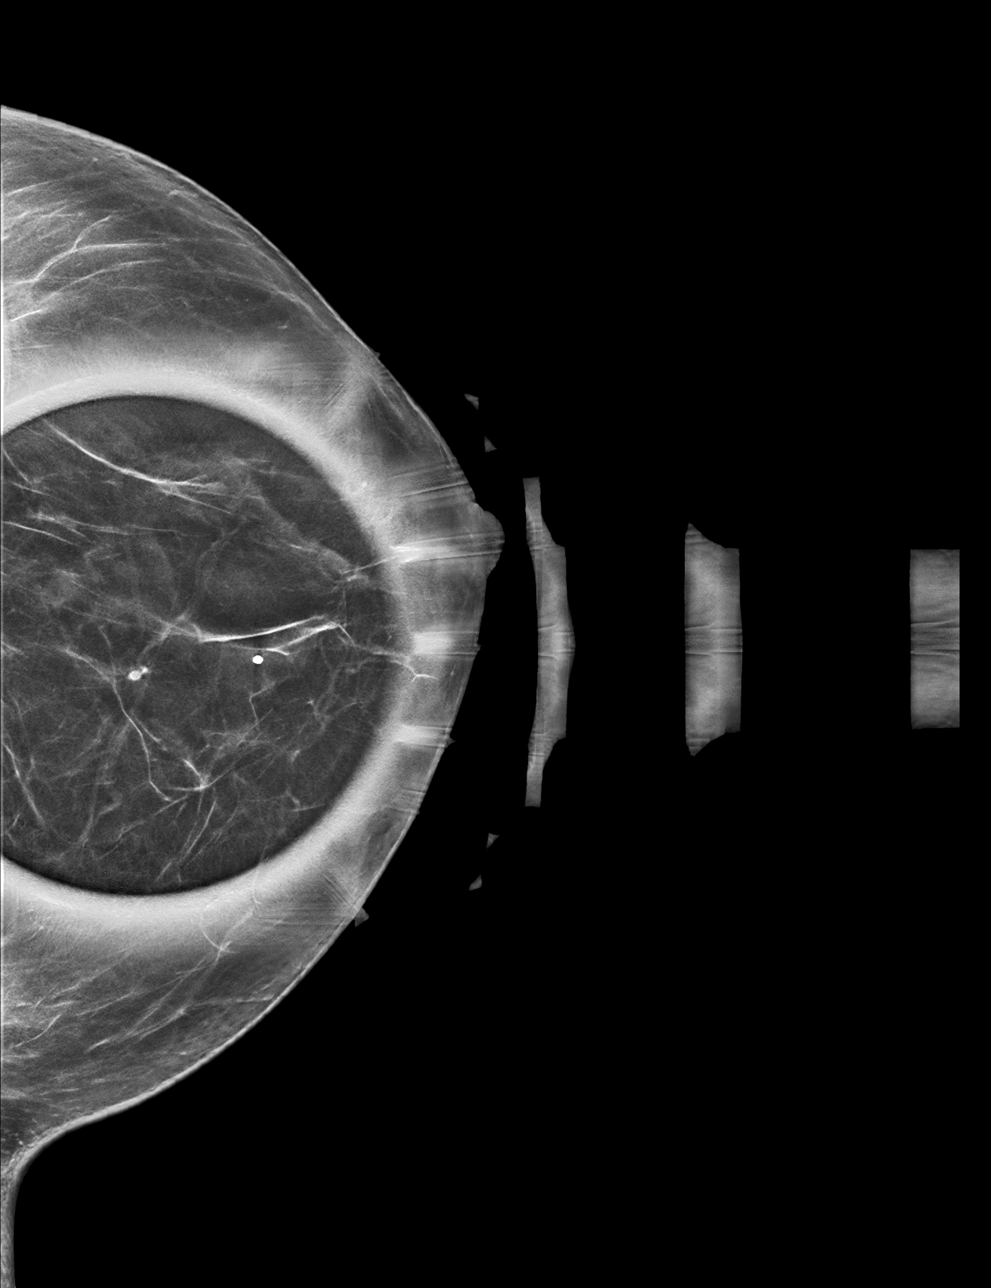

[L ML synth-2D]
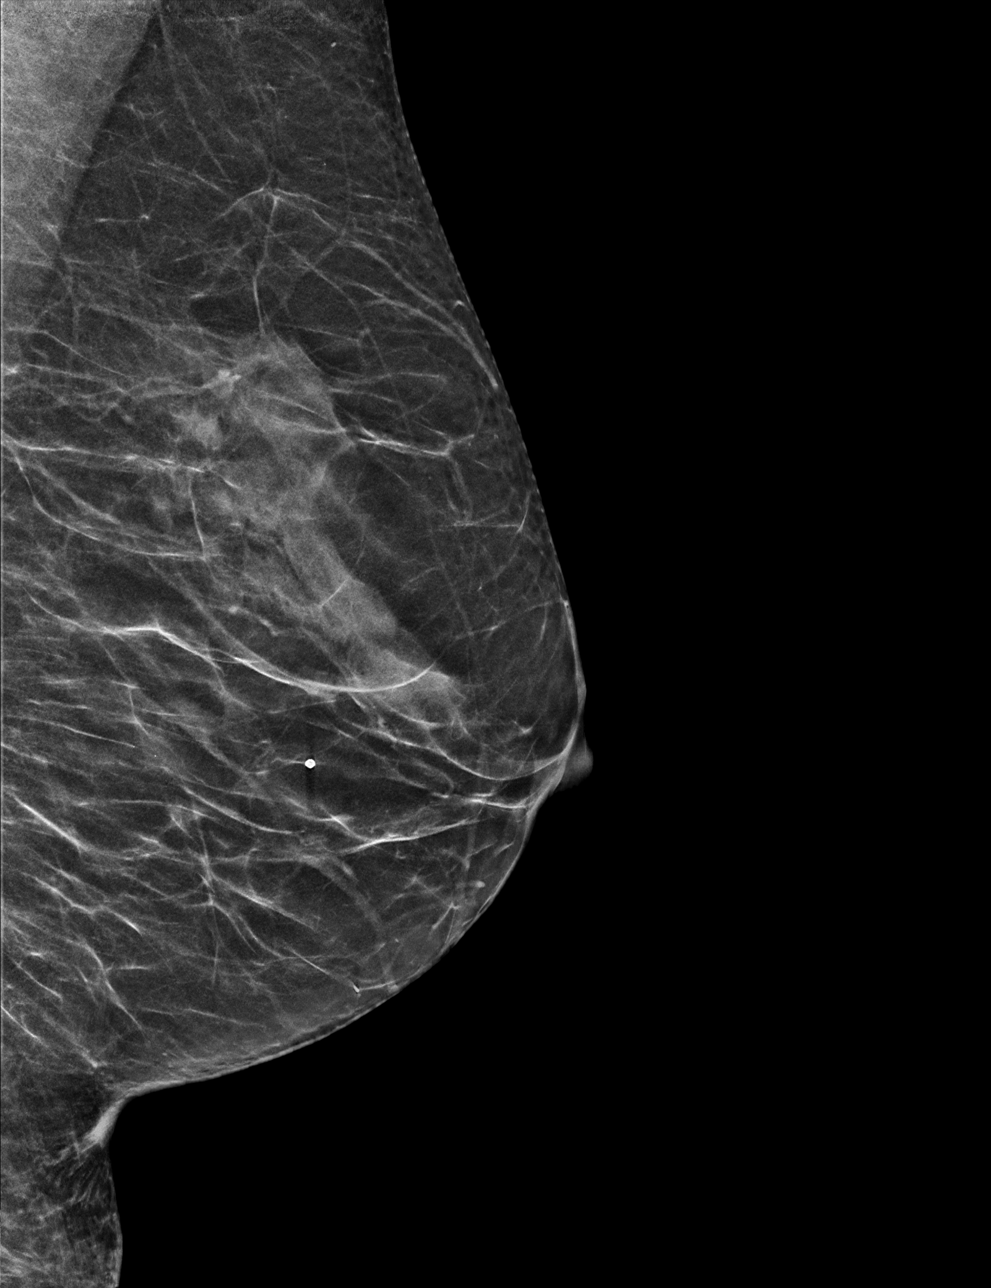

[L CC tomo · tomo slice 27/53.0]
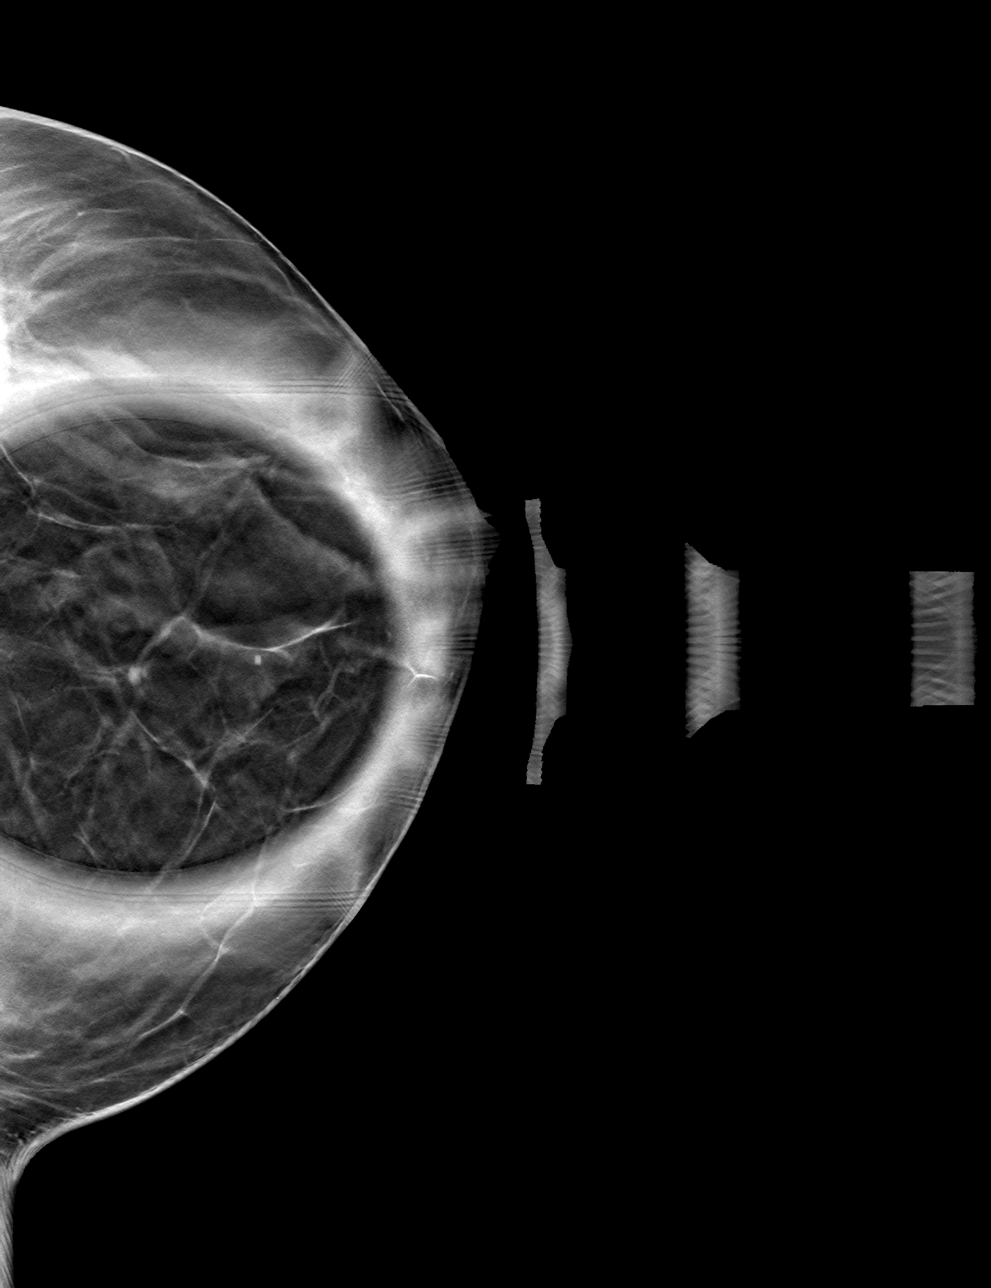

[L ML tomo · tomo slice 23/45.0]
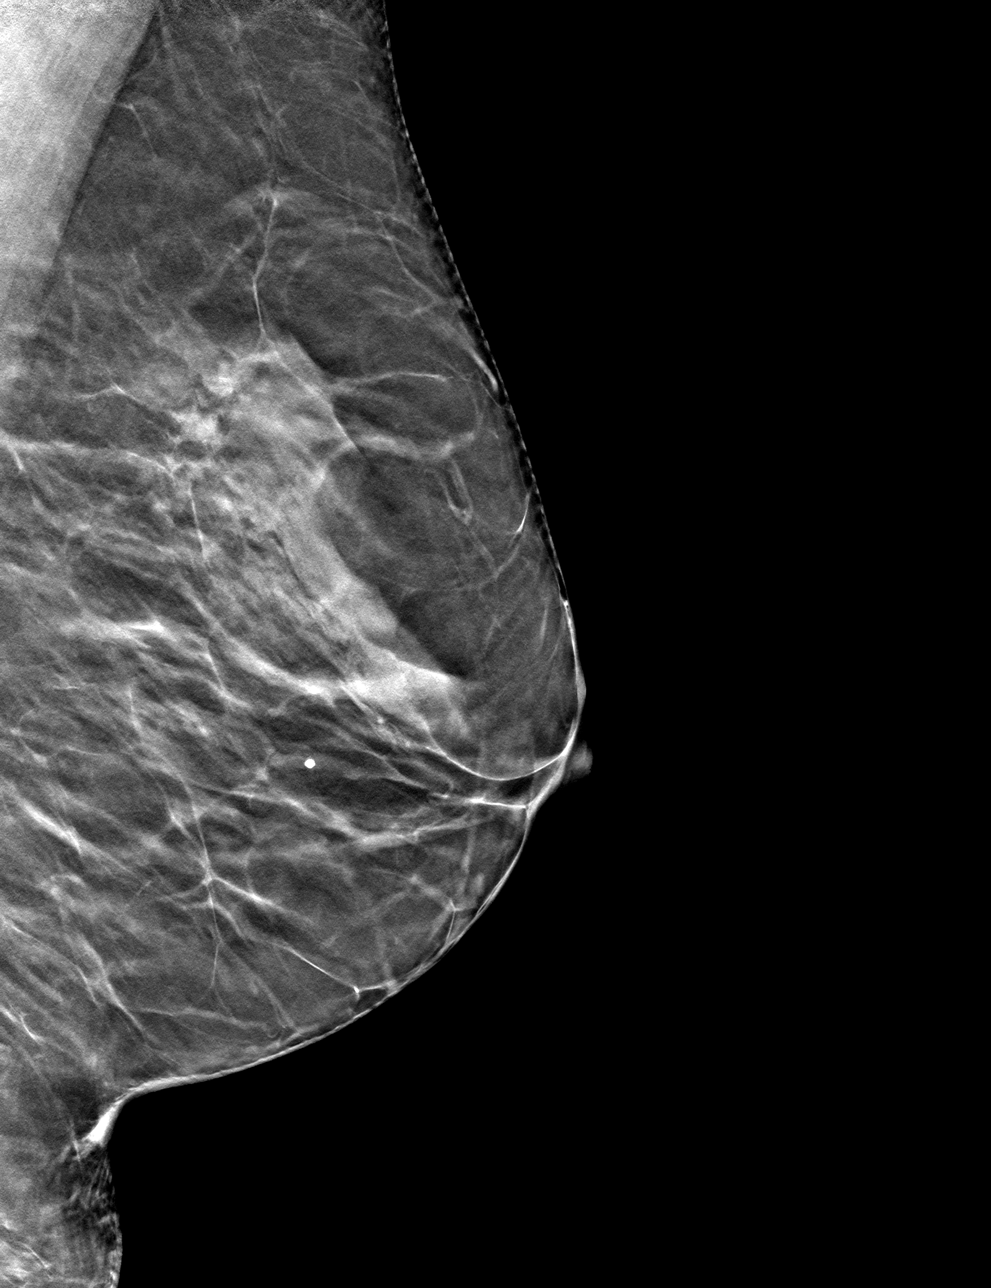

[4 of 12 positions shown; findings below may reference images not displayed]

ACR Breast Density Category b: There are scattered areas of
fibroglandular density.
FINDINGS: Additional mammographic views of the left breast demonstrate
circumscribed mass in the upper central left breast, posterior depth
measuring 8 mm mammographically.

Targeted left breast ultrasound was performed showing no definite
correlation to the mammographically seen focal asymmetry. Incidental
note is made of 9 mm prominent breast lobule in the left 11:30
o'clock breast. There is no evidence of left axillary
lymphadenopathy.
IMPRESSION: Left breast upper outer quadrant focal asymmetry without definite
sonographic correlation, for which stereotactic core needle biopsy
is recommended.

RECOMMENDATION:
One site stereotactic core needle biopsy of the left breast.

I have discussed the findings and recommendations with the patient.
If applicable, a reminder letter will be sent to the patient
regarding the next appointment.

BI-RADS CATEGORY  4: Suspicious.

## 2022-03-12 ENCOUNTER — Other Ambulatory Visit: Payer: Self-pay

## 2022-03-12 ENCOUNTER — Ambulatory Visit
Admission: RE | Admit: 2022-03-12 | Discharge: 2022-03-12 | Disposition: A | Discharge status: Home / Self Care | Payer: Medicare Other | Source: Ambulatory Visit | Attending: Internal Medicine | Admitting: Internal Medicine

## 2022-03-12 DIAGNOSIS — R928 Other abnormal and inconclusive findings on diagnostic imaging of breast: Secondary | ICD-10-CM

## 2022-03-12 DIAGNOSIS — N641 Fat necrosis of breast: Secondary | ICD-10-CM | POA: Diagnosis not present

## 2022-03-12 DIAGNOSIS — Z1231 Encounter for screening mammogram for malignant neoplasm of breast: Secondary | ICD-10-CM | POA: Diagnosis present

## 2022-03-12 DIAGNOSIS — N63 Unspecified lump in unspecified breast: Secondary | ICD-10-CM

## 2022-03-12 DIAGNOSIS — N6322 Unspecified lump in the left breast, upper inner quadrant: Secondary | ICD-10-CM | POA: Diagnosis not present

## 2022-03-13 LAB — SURGICAL PATHOLOGY

## 2022-03-14 HISTORY — PX: BREAST BIOPSY: SHX20

## 2022-07-28 ENCOUNTER — Other Ambulatory Visit: Payer: Self-pay | Admitting: Internal Medicine

## 2022-07-28 DIAGNOSIS — N63 Unspecified lump in unspecified breast: Secondary | ICD-10-CM

## 2022-09-15 ENCOUNTER — Ambulatory Visit
Admission: RE | Admit: 2022-09-15 | Discharge: 2022-09-15 | Disposition: A | Discharge status: Home / Self Care | Payer: Medicare Other | Source: Ambulatory Visit | Attending: Internal Medicine | Admitting: Internal Medicine

## 2022-09-15 DIAGNOSIS — N63 Unspecified lump in unspecified breast: Secondary | ICD-10-CM

## 2022-09-15 DIAGNOSIS — R922 Inconclusive mammogram: Secondary | ICD-10-CM | POA: Diagnosis not present

## 2022-12-31 ENCOUNTER — Other Ambulatory Visit: Payer: Self-pay | Admitting: Internal Medicine

## 2022-12-31 DIAGNOSIS — Z1231 Encounter for screening mammogram for malignant neoplasm of breast: Secondary | ICD-10-CM

## 2023-02-12 ENCOUNTER — Ambulatory Visit
Admission: RE | Admit: 2023-02-12 | Discharge: 2023-02-12 | Disposition: A | Discharge status: Home / Self Care | Payer: Medicare Other | Source: Ambulatory Visit | Attending: Internal Medicine | Admitting: Internal Medicine

## 2023-02-12 DIAGNOSIS — Z1231 Encounter for screening mammogram for malignant neoplasm of breast: Secondary | ICD-10-CM | POA: Insufficient documentation

## 2023-03-20 ENCOUNTER — Other Ambulatory Visit: Payer: Self-pay | Admitting: Otolaryngology

## 2023-03-20 ENCOUNTER — Ambulatory Visit
Admission: RE | Admit: 2023-03-20 | Discharge: 2023-03-20 | Disposition: A | Discharge status: Home / Self Care | Payer: Medicare Other | Source: Ambulatory Visit | Attending: Otolaryngology | Admitting: Otolaryngology

## 2023-03-20 DIAGNOSIS — R059 Cough, unspecified: Secondary | ICD-10-CM

## 2023-04-22 ENCOUNTER — Other Ambulatory Visit: Payer: Self-pay | Admitting: Internal Medicine

## 2023-04-22 DIAGNOSIS — R7989 Other specified abnormal findings of blood chemistry: Secondary | ICD-10-CM

## 2023-04-29 ENCOUNTER — Ambulatory Visit
Admission: RE | Admit: 2023-04-29 | Discharge: 2023-04-29 | Disposition: A | Discharge status: Home / Self Care | Payer: Medicare Other | Source: Ambulatory Visit | Attending: Internal Medicine | Admitting: Internal Medicine

## 2023-04-29 DIAGNOSIS — R7989 Other specified abnormal findings of blood chemistry: Secondary | ICD-10-CM

## 2023-10-14 ENCOUNTER — Other Ambulatory Visit: Payer: Self-pay | Admitting: Internal Medicine

## 2023-10-14 DIAGNOSIS — Z1231 Encounter for screening mammogram for malignant neoplasm of breast: Secondary | ICD-10-CM

## 2024-02-15 ENCOUNTER — Ambulatory Visit
Admission: RE | Admit: 2024-02-15 | Discharge: 2024-02-15 | Disposition: A | Discharge status: Home / Self Care | Payer: Medicare Other | Source: Ambulatory Visit | Attending: Internal Medicine | Admitting: Internal Medicine

## 2024-02-15 DIAGNOSIS — Z1231 Encounter for screening mammogram for malignant neoplasm of breast: Secondary | ICD-10-CM | POA: Diagnosis present

## 2024-10-20 ENCOUNTER — Other Ambulatory Visit: Payer: Self-pay | Admitting: Internal Medicine

## 2024-10-20 DIAGNOSIS — Z1231 Encounter for screening mammogram for malignant neoplasm of breast: Secondary | ICD-10-CM

## 2024-10-21 ENCOUNTER — Other Ambulatory Visit: Payer: Self-pay | Admitting: Internal Medicine

## 2024-10-21 DIAGNOSIS — E7800 Pure hypercholesterolemia, unspecified: Secondary | ICD-10-CM

## 2024-10-21 DIAGNOSIS — I10 Essential (primary) hypertension: Secondary | ICD-10-CM

## 2024-10-25 ENCOUNTER — Ambulatory Visit
Admission: RE | Admit: 2024-10-25 | Discharge: 2024-10-25 | Disposition: A | Discharge status: Home / Self Care | Payer: Self-pay | Source: Ambulatory Visit | Attending: Internal Medicine | Admitting: Internal Medicine

## 2024-10-25 DIAGNOSIS — E7800 Pure hypercholesterolemia, unspecified: Secondary | ICD-10-CM | POA: Insufficient documentation

## 2024-10-25 DIAGNOSIS — I10 Essential (primary) hypertension: Secondary | ICD-10-CM | POA: Insufficient documentation

## 2024-11-30 ENCOUNTER — Telehealth: Payer: Self-pay

## 2024-11-30 NOTE — Telephone Encounter (Signed)
 Pt left voicemail message to schedule  procedure.

## 2024-12-01 ENCOUNTER — Other Ambulatory Visit: Payer: Self-pay

## 2024-12-01 ENCOUNTER — Telehealth: Payer: Self-pay

## 2024-12-01 DIAGNOSIS — Z85038 Personal history of other malignant neoplasm of large intestine: Secondary | ICD-10-CM

## 2024-12-01 MED ORDER — NA SULFATE-K SULFATE-MG SULF 17.5-3.13-1.6 GM/177ML PO SOLN: 1.0000 | Freq: Once | ORAL | 0 refills | Status: AC

## 2024-12-01 NOTE — Telephone Encounter (Signed)
 Gastroenterology Pre-Procedure Review  Request Date: 05/09/25 Requesting Physician: Dr. Jinny  PATIENT REVIEW QUESTIONS: The patient responded to the following health history questions as indicated:    1. Are you having any GI issues? no 2. Do you have a personal history of Polyps? yes (last colonoscopy was 04/2020 polyps were present, pt does have a history of colon cancer also) 3. Do you have a family history of Colon Cancer or Polyps? yes (father and sister had colon polyps) 4. Diabetes Mellitus? no 5. Joint replacements in the past 12 months?no 6. Major health problems in the past 3 months?no 7. Any artificial heart valves, MVP, or defibrillator?no    MEDICATIONS & ALLERGIES:    Patient reports the following regarding taking any anticoagulation/antiplatelet therapy:   Plavix, Coumadin, Eliquis, Xarelto, Lovenox, Pradaxa, Brilinta, or Effient? no Aspirin? no  Patient confirms/reports the following medications:  Current Outpatient Medications  Medication Sig Dispense Refill   Adapalene-Benzoyl Peroxide 0.1-2.5 % gel as needed Apply thinly to face at night     cetirizine (ZYRTEC) 10 MG tablet Take by mouth.     Cholecalciferol 50 MCG (2000 UT) CAPS Take by mouth.     Cyanocobalamin (VITAMIN B-12 PO) Take by mouth daily.     Docusate Sodium (DSS) 100 MG CAPS Take by mouth.     fluticasone (FLONASE) 50 MCG/ACT nasal spray      hydrochlorothiazide (HYDRODIURIL) 25 MG tablet Take 25 mg by mouth daily.     levothyroxine (SYNTHROID) 125 MCG tablet PLEASE SEE ATTACHED FOR DETAILED DIRECTIONS     Multiple Vitamin (MULTIVITAMIN) tablet Take 1 tablet by mouth daily.     naproxen sodium (ALEVE) 220 MG tablet Take by mouth.     omeprazole (PRILOSEC) 40 MG capsule Take 40 mg by mouth daily.     No current facility-administered medications for this visit.    Patient confirms/reports the following allergies:  No Known Allergies  No orders of the defined types were placed in this  encounter.   AUTHORIZATION INFORMATION Primary Insurance: 1D#: Group #:  Secondary Insurance: 1D#: Group #:  SCHEDULE INFORMATION: Date: 05/09/25 Time: Location: [facility]

## 2025-01-18 ENCOUNTER — Telehealth: Payer: Self-pay

## 2025-01-18 NOTE — Telephone Encounter (Signed)
 Called patient to let her know that Dr. Jinny was taking off on 05/09/2025, therefore, we needed to reschedule her procedure. Patient understood and decided to switch to 05/23/2025. Leslie from the endo unit was notified of the change. Olam was also notified.

## 2025-02-16 ENCOUNTER — Encounter

## 2025-05-23 ENCOUNTER — Ambulatory Visit: Admit: 2025-05-23 | Admitting: Gastroenterology

## 2025-05-23 SURGERY — COLONOSCOPY
Anesthesia: General
